# Patient Record
Sex: Female | Born: 1995 | Race: White | Hispanic: No | Marital: Single | Smoking: Never smoker
Health system: Southern US, Community
[De-identification: ages and names within clinical notes are randomized; demographics above are authoritative.]

## PROBLEM LIST (undated history)

## (undated) DIAGNOSIS — Z789 Other specified health status: Secondary | ICD-10-CM

## (undated) HISTORY — PX: GANGLION CYST EXCISION: SHX1691

## (undated) HISTORY — DX: Other specified health status: Z78.9

## (undated) HISTORY — PX: WISDOM TOOTH EXTRACTION: SHX21

---
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
## 2019-06-06 NOTE — L&D Delivery Note (Signed)
[facility]N [facility] Delivery Note  Maxie AMARIZ FLAMENCO is a 24 y.o. G1P0 s/p vaginal delivery at [redacted]w[redacted]d. She was admitted for labor management s/p SROM.   ROM: 22h 18m with clear fluid GBS Status: negative Maximum Maternal Temperature: 98.81F  Labor Progress: Pt was admitted s/p SROM for clear fluid at home at approximately 9AM on 03/01/20. On arrival pt was started on pitocin with good fetal tolerance. She had complete cervical dilation at [phone] and then delivered without complication as noted below.  Delivery Date/Time: [phone] on 03/02/20 Delivery: Called to room and patient was complete and pushing. Head delivered LOA. No nuchal cord present. Shoulder and body delivered in usual fashion. Infant with spontaneous cry, placed on mother's abdomen, dried and stimulated. Cord clamped x 2 after 1-minute delay, and cut by FOB under my direct supervision. Cord blood drawn. Placenta delivered spontaneously with gentle cord traction. Fundus firm with massage and Pitocin. Labia, perineum, vagina, and cervix were inspected, and notable for a second degree perineal laceration and right sulcal laceration.  Placenta: intact, 3-vessel cord Complications: none Lacerations: second degree perineal laceration, right sulcal laceration s/p repair EBL: 150 ml Analgesia: epidural  Infant: female  APGARs 8 & 9  weight 3315 g  Lynnda Shields, MD OB/GYN Fellow, Faculty Practice

## 2019-07-01 ENCOUNTER — Telehealth: Payer: Self-pay | Admitting: Adult Health

## 2019-07-01 NOTE — Telephone Encounter (Signed)

## 2019-07-02 ENCOUNTER — Encounter: Payer: Self-pay | Admitting: Adult Health

## 2019-07-02 ENCOUNTER — Other Ambulatory Visit: Payer: Self-pay

## 2019-07-02 ENCOUNTER — Ambulatory Visit (INDEPENDENT_AMBULATORY_CARE_PROVIDER_SITE_OTHER): Payer: BC Managed Care – PPO | Admitting: Adult Health

## 2019-07-02 VITALS — BP 114/71 | HR 94 | Ht 63.0 in | Wt 132.0 lb

## 2019-07-02 DIAGNOSIS — Z3A01 Less than 8 weeks gestation of pregnancy: Secondary | ICD-10-CM

## 2019-07-02 DIAGNOSIS — Z3201 Encounter for pregnancy test, result positive: Secondary | ICD-10-CM

## 2019-07-02 DIAGNOSIS — O3680X0 Pregnancy with inconclusive fetal viability, not applicable or unspecified: Secondary | ICD-10-CM | POA: Diagnosis not present

## 2019-07-02 LAB — POCT URINE PREGNANCY: Preg Test, Ur: POSITIVE — AB

## 2019-07-02 NOTE — Progress Notes (Signed)
  Subjective:     Patient ID: Pamela Gomez, female   DOB: 27-May-1996, 24 y.o.   MRN: [id number]  HPI Pamela Gomez is a 24 year old white female, single,(she lives with boyfriend) in for UPT, has missed a period and had 3+HPTs.She has had some nausea and spotting after sex, no pain.She is in radiology school.  PCP is [facility].  Review of Systems +missed period 3+HPTs +nausea +spotted after sex, pink/brown,no pain  Reviewed past medical,surgical, social and family history. Reviewed medications and allergies.     Objective:   Physical Exam BP 114/71 (BP Location: Right Arm, Patient Position: Sitting, Cuff Size: Normal)   Pulse 94   Ht 5\' 3"  (1.6 m)   Wt 132 lb (59.9 kg)   LMP 05/20/2019   BMI 23.38 kg/m UPT +, about 6+1 weeks by LMP with EDD 02/24/20.Skin warm and dry. Neck: mid line trachea, normal thyroid, good ROM, no lymphadenopathy noted. Lungs: clear to ausculation bilaterally. Cardiovascular: regular rate and rhythm.Abdomen is soft and non tender Fall risk is moderate, she says she is clumsy PHQ 2 score is 1    Assessment:     1. Pregnancy examination or test, positive result  2. Less than [redacted] weeks gestation of pregnancy Continue PNV   3. Encounter to determine fetal viability of pregnancy, single or unspecified fetus Return in about 2 weeks for dating 02/26/20    Plan:     Review handout by [facility]

## 2019-07-02 NOTE — Patient Instructions (Signed)
First Trimester of Pregnancy The first trimester of pregnancy is from week 1 until the end of week 13 (months 1 through 3). A week after a sperm fertilizes an egg, the egg will implant on the wall of the uterus. This embryo will begin to develop into a baby. Genes from you and your partner will form the baby. The female genes will determine whether the baby will be a boy or a girl. At 6-8 weeks, the eyes and face will be formed, and the heartbeat can be seen on ultrasound. At the end of 12 weeks, all the baby's organs will be formed. Now that you are pregnant, you will want to do everything you can to have a healthy baby. Two of the most important things are to get good prenatal care and to follow your health care provider's instructions. Prenatal care is all the medical care you receive before the baby's birth. This care will help prevent, find, and treat any problems during the pregnancy and childbirth. Body changes during your first trimester Your body goes through many changes during pregnancy. The changes vary from woman to woman.  You may gain or lose a couple of pounds at first.  You may feel sick to your stomach (nauseous) and you may throw up (vomit). If the vomiting is uncontrollable, call your health care provider.  You may tire easily.  You may develop headaches that can be relieved by medicines. All medicines should be approved by your health care provider.  You may urinate more often. Painful urination may mean you have a bladder infection.  You may develop heartburn as a result of your pregnancy.  You may develop constipation because certain hormones are causing the muscles that push stool through your intestines to slow down.  You may develop hemorrhoids or swollen veins (varicose veins).  Your breasts may begin to grow larger and become tender. Your nipples may stick out more, and the tissue that surrounds them (areola) may become darker.  Your gums may bleed and may be  sensitive to brushing and flossing.  Dark spots or blotches (chloasma, mask of pregnancy) may develop on your face. This will likely fade after the baby is born.  Your menstrual periods will stop.  You may have a loss of appetite.  You may develop cravings for certain kinds of food.  You may have changes in your emotions from day to day, such as being excited to be pregnant or being concerned that something may go wrong with the pregnancy and baby.  You may have more vivid and strange dreams.  You may have changes in your hair. These can include thickening of your hair, rapid growth, and changes in texture. Some women also have hair loss during or after pregnancy, or hair that feels dry or thin. Your hair will most likely return to normal after your baby is born. What to expect at prenatal visits During a routine prenatal visit:  You will be weighed to make sure you and the baby are growing normally.  Your blood pressure will be taken.  Your abdomen will be measured to track your baby's growth.  The fetal heartbeat will be listened to between weeks 10 and 14 of your pregnancy.  Test results from any previous visits will be discussed. Your health care provider may ask you:  How you are feeling.  If you are feeling the baby move.  If you have had any abnormal symptoms, such as leaking fluid, bleeding, severe headaches, or abdominal   cramping.  If you are using any tobacco products, including cigarettes, chewing tobacco, and electronic cigarettes.  If you have any questions. Other tests that may be performed during your first trimester include:  Blood tests to find your blood type and to check for the presence of any previous infections. The tests will also be used to check for low iron levels (anemia) and protein on red blood cells (Rh antibodies). Depending on your risk factors, or if you previously had diabetes during pregnancy, you may have tests to check for high blood sugar  that affects pregnant women (gestational diabetes).  Urine tests to check for infections, diabetes, or protein in the urine.  An ultrasound to confirm the proper growth and development of the baby.  Fetal screens for spinal cord problems (spina bifida) and Down syndrome.  HIV (human immunodeficiency virus) testing. Routine prenatal testing includes screening for HIV, unless you choose not to have this test.  You may need other tests to make sure you and the baby are doing well. Follow these instructions at home: Medicines  Follow your health care provider's instructions regarding medicine use. Specific medicines may be either safe or unsafe to take during pregnancy.  Take a prenatal vitamin that contains at least 600 micrograms (mcg) of folic acid.  If you develop constipation, try taking a stool softener if your health care provider approves. Eating and drinking   Eat a balanced diet that includes fresh fruits and vegetables, whole grains, good sources of protein such as meat, eggs, or tofu, and low-fat dairy. Your health care provider will help you determine the amount of weight gain that is right for you.  Avoid raw meat and uncooked cheese. These carry germs that can cause birth defects in the baby.  Eating four or five small meals rather than three large meals a day may help relieve nausea and vomiting. If you start to feel nauseous, eating a few soda crackers can be helpful. Drinking liquids between meals, instead of during meals, also seems to help ease nausea and vomiting.  Limit foods that are high in fat and processed sugars, such as fried and sweet foods.  To prevent constipation: ? Eat foods that are high in fiber, such as fresh fruits and vegetables, whole grains, and beans. ? Drink enough fluid to keep your urine clear or pale yellow. Activity  Exercise only as directed by your health care provider. Most women can continue their usual exercise routine during  pregnancy. Try to exercise for 30 minutes at least 5 days a week. Exercising will help you: ? Control your weight. ? Stay in shape. ? Be prepared for labor and delivery.  Experiencing pain or cramping in the lower abdomen or lower back is a good sign that you should stop exercising. Check with your health care provider before continuing with normal exercises.  Try to avoid standing for long periods of time. Move your legs often if you must stand in one place for a long time.  Avoid heavy lifting.  Wear low-heeled shoes and practice good posture.  You may continue to have sex unless your health care provider tells you not to. Relieving pain and discomfort  Wear a good support bra to relieve breast tenderness.  Take warm sitz baths to soothe any pain or discomfort caused by hemorrhoids. Use hemorrhoid cream if your health care provider approves.  Rest with your legs elevated if you have leg cramps or low back pain.  If you develop varicose veins in   your legs, wear support hose. Elevate your feet for 15 minutes, 3-4 times a day. Limit salt in your diet. Prenatal care  Schedule your prenatal visits by the twelfth week of pregnancy. They are usually scheduled monthly at first, then more often in the last 2 months before delivery.  Write down your questions. Take them to your prenatal visits.  Keep all your prenatal visits as told by your health care provider. This is important. Safety  Wear your seat belt at all times when driving.  Make a list of emergency phone numbers, including numbers for family, friends, the hospital, and police and fire departments. General instructions  Ask your health care provider for a referral to a local prenatal education class. Begin classes no later than the beginning of month 6 of your pregnancy.  Ask for help if you have counseling or nutritional needs during pregnancy. Your health care provider can offer advice or refer you to specialists for help  with various needs.  Do not use hot tubs, steam rooms, or saunas.  Do not douche or use tampons or scented sanitary pads.  Do not cross your legs for long periods of time.  Avoid cat litter boxes and soil used by cats. These carry germs that can cause birth defects in the baby and possibly loss of the fetus by miscarriage or stillbirth.  Avoid all smoking, herbs, alcohol, and medicines not prescribed by your health care provider. Chemicals in these products affect the formation and growth of the baby.  Do not use any products that contain nicotine or tobacco, such as cigarettes and e-cigarettes. If you need help quitting, ask your health care provider. You may receive counseling support and other resources to help you quit.  Schedule a dentist appointment. At home, brush your teeth with a soft toothbrush and be gentle when you floss. Contact a health care provider if:  You have dizziness.  You have mild pelvic cramps, pelvic pressure, or nagging pain in the abdominal area.  You have persistent nausea, vomiting, or diarrhea.  You have a bad smelling vaginal discharge.  You have pain when you urinate.  You notice increased swelling in your face, hands, legs, or ankles.  You are exposed to fifth disease or chickenpox.  You are exposed to German measles (rubella) and have never had it. Get help right away if:  You have a fever.  You are leaking fluid from your vagina.  You have spotting or bleeding from your vagina.  You have severe abdominal cramping or pain.  You have rapid weight gain or loss.  You vomit blood or material that looks like coffee grounds.  You develop a severe headache.  You have shortness of breath.  You have any kind of trauma, such as from a fall or a car accident. Summary  The first trimester of pregnancy is from week 1 until the end of week 13 (months 1 through 3).  Your body goes through many changes during pregnancy. The changes vary from  woman to woman.  You will have routine prenatal visits. During those visits, your health care provider will examine you, discuss any test results you may have, and talk with you about how you are feeling. This information is not intended to replace advice given to you by your health care provider. Make sure you discuss any questions you have with your health care provider. Document Revised: 05/04/2017 Document Reviewed: 05/03/2016 Elsevier Patient Education  2020 [employer]  

## 2019-07-16 ENCOUNTER — Telehealth: Payer: Self-pay | Admitting: Obstetrics & Gynecology

## 2019-07-16 NOTE — Telephone Encounter (Signed)

## 2019-07-17 ENCOUNTER — Ambulatory Visit (INDEPENDENT_AMBULATORY_CARE_PROVIDER_SITE_OTHER): Payer: Self-pay

## 2019-07-17 ENCOUNTER — Other Ambulatory Visit: Payer: Self-pay

## 2019-07-17 DIAGNOSIS — O3680X0 Pregnancy with inconclusive fetal viability, not applicable or unspecified: Secondary | ICD-10-CM

## 2019-07-17 DIAGNOSIS — Z3A08 8 weeks gestation of pregnancy: Secondary | ICD-10-CM

## 2019-07-17 NOTE — Progress Notes (Signed)
[place] 8+2 wks,single IUP w/ys,positive fht 176 bpm,normal ovaries,crl 18.64 mm

## 2019-08-13 ENCOUNTER — Telehealth: Payer: Self-pay | Admitting: Obstetrics & Gynecology

## 2019-08-13 ENCOUNTER — Other Ambulatory Visit: Payer: Self-pay | Admitting: Obstetrics and Gynecology

## 2019-08-13 DIAGNOSIS — Z3682 Encounter for antenatal screening for nuchal translucency: Secondary | ICD-10-CM

## 2019-08-13 NOTE — Telephone Encounter (Signed)

## 2019-08-14 ENCOUNTER — Ambulatory Visit (INDEPENDENT_AMBULATORY_CARE_PROVIDER_SITE_OTHER): Payer: BC Managed Care – PPO

## 2019-08-14 ENCOUNTER — Ambulatory Visit: Payer: BC Managed Care – PPO | Admitting: *Deleted

## 2019-08-14 ENCOUNTER — Ambulatory Visit (INDEPENDENT_AMBULATORY_CARE_PROVIDER_SITE_OTHER): Payer: BC Managed Care – PPO | Admitting: Advanced Practice Midwife

## 2019-08-14 ENCOUNTER — Other Ambulatory Visit: Payer: Self-pay

## 2019-08-14 ENCOUNTER — Encounter: Payer: Self-pay | Admitting: Advanced Practice Midwife

## 2019-08-14 VITALS — BP 118/65 | HR 82 | Wt 129.0 lb

## 2019-08-14 DIAGNOSIS — Z3401 Encounter for supervision of normal first pregnancy, first trimester: Secondary | ICD-10-CM

## 2019-08-14 DIAGNOSIS — Z3A12 12 weeks gestation of pregnancy: Secondary | ICD-10-CM

## 2019-08-14 DIAGNOSIS — Z3402 Encounter for supervision of normal first pregnancy, second trimester: Secondary | ICD-10-CM

## 2019-08-14 DIAGNOSIS — Z3682 Encounter for antenatal screening for nuchal translucency: Secondary | ICD-10-CM | POA: Diagnosis not present

## 2019-08-14 DIAGNOSIS — Z3400 Encounter for supervision of normal first pregnancy, unspecified trimester: Secondary | ICD-10-CM | POA: Insufficient documentation

## 2019-08-14 LAB — POCT URINALYSIS DIPSTICK OB
Blood, UA: NEGATIVE
Glucose, UA: NEGATIVE
Ketones, UA: NEGATIVE
Leukocytes, UA: NEGATIVE
Nitrite, UA: NEGATIVE
POC,PROTEIN,UA: NEGATIVE

## 2019-08-14 MED ORDER — BLOOD PRESSURE MONITOR MISC: 0 refills | Status: DC

## 2019-08-14 NOTE — Patient Instructions (Signed)
Pamela Gomez, I greatly value your feedback.  If you receive a survey following your visit with [place] today, we appreciate you taking the time to fill it out.  Thanks, Cathie Beams, DNP, CNM  [facility] HAS MOVED!!! It is now [facility] at [facility] ([address] [place], [place] [address]) Entrance located off of [address] Free 24/7 valet parking   Nausea & Vomiting  Have saltine crackers or pretzels by your bed and eat a few bites before you raise your head out of bed in the morning  Eat small frequent meals throughout the day instead of large meals  Drink plenty of fluids throughout the day to stay hydrated, just don't drink a lot of fluids with your meals.  This can make your stomach fill up faster making you feel sick  Do not brush your teeth right after you eat  Products with real ginger are good for nausea, like ginger ale and ginger hard candy Make sure it says made with real ginger!  Sucking on sour candy like lemon heads is also good for nausea  If your prenatal vitamins make you nauseated, take them at night so you will sleep through the nausea  Sea Bands  If you feel like you need medicine for the nausea & vomiting please let us know  If you are unable to keep any fluids or food down please let us know   Constipation  Drink plenty of fluid, preferably water, throughout the day  Eat foods high in fiber such as fruits, vegetables, and grains  Exercise, such as walking, is a good way to keep your bowels regular  Drink warm fluids, especially warm prune juice, or decaf coffee  Eat a 1/2 cup of real oatmeal (not instant), 1/2 cup applesauce, and 1/2-1 cup warm prune juice every day  If needed, you may take Colace (docusate sodium) stool softener once or twice a day to help keep the stool soft.   If you still are having problems with constipation, you may take Miralax once daily as needed to help keep your bowels regular.   Home  Blood Pressure Monitoring for Patients   Your provider has recommended that you check your blood pressure (BP) at least once a week at home. If you do not have a blood pressure cuff at home, one will be provided for you. Contact your provider if you have not received your monitor within 1 week.   Helpful Tips for Accurate Home Blood Pressure Checks  . Don't smoke, exercise, or drink caffeine 30 minutes before checking your BP . Use the restroom before checking your BP (a full bladder can raise your pressure) . Relax in a comfortable upright chair . Feet on the ground . Left arm resting comfortably on a flat surface at the level of your heart . Legs uncrossed . Back supported . Sit quietly and don't talk . Place the cuff on your bare arm . Adjust snuggly, so that only two fingertips can fit between your skin and the top of the cuff . Check 2 readings separated by at least one minute . Keep a log of your BP readings . For a visual, please reference this diagram: http://ccnc.care/bpdiagram  Provider Name: [facility]     Phone: [phone]  Zone 1: ALL CLEAR  Continue to monitor your symptoms:  . BP reading is less than 140 (top number) or less than 90 (bottom number)  . No right upper stomach  pain . No headaches or seeing spots . No feeling nauseated or throwing up . No swelling in face and hands  Zone 2: CAUTION Call your doctor's office for any of the following:  . BP reading is greater than 140 (top number) or greater than 90 (bottom number)  . Stomach pain under your ribs in the middle or right side . Headaches or seeing spots . Feeling nauseated or throwing up . Swelling in face and hands  Zone 3: EMERGENCY  Seek immediate medical care if you have any of the following:  . BP reading is greater than160 (top number) or greater than 110 (bottom number) . Severe headaches not improving with Tylenol . Serious difficulty catching your breath . Any worsening symptoms  from Zone 2    First Trimester of Pregnancy The first trimester of pregnancy is from week 1 until the end of week 12 (months 1 through 3). A week after a sperm fertilizes an egg, the egg will implant on the wall of the uterus. This embryo will begin to develop into a baby. Genes from you and your partner are forming the baby. The female genes determine whether the baby is a boy or a girl. At 6-8 weeks, the eyes and face are formed, and the heartbeat can be seen on ultrasound. At the end of 12 weeks, all the baby's organs are formed.  Now that you are pregnant, you will want to do everything you can to have a healthy baby. Two of the most important things are to get good prenatal care and to follow your health care provider's instructions. Prenatal care is all the medical care you receive before the baby's birth. This care will help prevent, find, and treat any problems during the pregnancy and childbirth. BODY CHANGES Your body goes through many changes during pregnancy. The changes vary from woman to woman.   You may gain or lose a couple of pounds at first.  You may feel sick to your stomach (nauseous) and throw up (vomit). If the vomiting is uncontrollable, call your health care provider.  You may tire easily.  You may develop headaches that can be relieved by medicines approved by your health care provider.  You may urinate more often. Painful urination may mean you have a bladder infection.  You may develop heartburn as a result of your pregnancy.  You may develop constipation because certain hormones are causing the muscles that push waste through your intestines to slow down.  You may develop hemorrhoids or swollen, bulging veins (varicose veins).  Your breasts may begin to grow larger and become tender. Your nipples may stick out more, and the tissue that surrounds them (areola) may become darker.  Your gums may bleed and may be sensitive to brushing and flossing.  Dark spots or  blotches (chloasma, mask of pregnancy) may develop on your face. This will likely fade after the baby is born.  Your menstrual periods will stop.  You may have a loss of appetite.  You may develop cravings for certain kinds of food.  You may have changes in your emotions from day to day, such as being excited to be pregnant or being concerned that something may go wrong with the pregnancy and baby.  You may have more vivid and strange dreams.  You may have changes in your hair. These can include thickening of your hair, rapid growth, and changes in texture. Some women also have hair loss during or after pregnancy, or hair that  feels dry or thin. Your hair will most likely return to normal after your baby is born. WHAT TO EXPECT AT YOUR PRENATAL VISITS During a routine prenatal visit:  You will be weighed to make sure you and the baby are growing normally.  Your blood pressure will be taken.  Your abdomen will be measured to track your baby's growth.  The fetal heartbeat will be listened to starting around week 10 or 12 of your pregnancy.  Test results from any previous visits will be discussed. Your health care provider may ask you:  How you are feeling.  If you are feeling the baby move.  If you have had any abnormal symptoms, such as leaking fluid, bleeding, severe headaches, or abdominal cramping.  If you have any questions. Other tests that may be performed during your first trimester include:  Blood tests to find your blood type and to check for the presence of any previous infections. They will also be used to check for low iron levels (anemia) and Rh antibodies. Later in the pregnancy, blood tests for diabetes will be done along with other tests if problems develop.  Urine tests to check for infections, diabetes, or protein in the urine.  An ultrasound to confirm the proper growth and development of the baby.  An amniocentesis to check for possible genetic problems.   Fetal screens for spina bifida and Down syndrome.  You may need other tests to make sure you and the baby are doing well. HOME CARE INSTRUCTIONS  Medicines  Follow your health care provider's instructions regarding medicine use. Specific medicines may be either safe or unsafe to take during pregnancy.  Take your prenatal vitamins as directed.  If you develop constipation, try taking a stool softener if your health care provider approves. Diet  Eat regular, well-balanced meals. Choose a variety of foods, such as meat or vegetable-based protein, fish, milk and low-fat dairy products, vegetables, fruits, and whole grain breads and cereals. Your health care provider will help you determine the amount of weight gain that is right for you.  Avoid raw meat and uncooked cheese. These carry germs that can cause birth defects in the baby.  Eating four or five small meals rather than three large meals a day may help relieve nausea and vomiting. If you start to feel nauseous, eating a few soda crackers can be helpful. Drinking liquids between meals instead of during meals also seems to help nausea and vomiting.  If you develop constipation, eat more high-fiber foods, such as fresh vegetables or fruit and whole grains. Drink enough fluids to keep your urine clear or pale yellow. Activity and Exercise  Exercise only as directed by your health care provider. Exercising will help you:  Control your weight.  Stay in shape.  Be prepared for labor and delivery.  Experiencing pain or cramping in the lower abdomen or low back is a good sign that you should stop exercising. Check with your health care provider before continuing normal exercises.  Try to avoid standing for long periods of time. Move your legs often if you must stand in one place for a long time.  Avoid heavy lifting.  Wear low-heeled shoes, and practice good posture.  You may continue to have sex unless your health care provider  directs you otherwise. Relief of Pain or Discomfort  Wear a good support bra for breast tenderness.    Take warm sitz baths to soothe any pain or discomfort caused by hemorrhoids. Use hemorrhoid cream  if your health care provider approves.    Rest with your legs elevated if you have leg cramps or low back pain.  If you develop varicose veins in your legs, wear support hose. Elevate your feet for 15 minutes, 3-4 times a day. Limit salt in your diet. Prenatal Care  Schedule your prenatal visits by the twelfth week of pregnancy. They are usually scheduled monthly at first, then more often in the last 2 months before delivery.  Write down your questions. Take them to your prenatal visits.  Keep all your prenatal visits as directed by your health care provider. Safety  Wear your seat belt at all times when driving.  Make a list of emergency phone numbers, including numbers for family, friends, the hospital, and police and fire departments. General Tips  Ask your health care provider for a referral to a local prenatal education class. Begin classes no later than at the beginning of month 6 of your pregnancy.  Ask for help if you have counseling or nutritional needs during pregnancy. Your health care provider can offer advice or refer you to specialists for help with various needs.  Do not use hot tubs, steam rooms, or saunas.  Do not douche or use tampons or scented sanitary pads.  Do not cross your legs for long periods of time.  Avoid cat litter boxes and soil used by cats. These carry germs that can cause birth defects in the baby and possibly loss of the fetus by miscarriage or stillbirth.  Avoid all smoking, herbs, alcohol, and medicines not prescribed by your health care provider. Chemicals in these affect the formation and growth of the baby.  Schedule a dentist appointment. At home, brush your teeth with a soft toothbrush and be gentle when you floss. SEEK MEDICAL CARE IF:    You have dizziness.  You have mild pelvic cramps, pelvic pressure, or nagging pain in the abdominal area.  You have persistent nausea, vomiting, or diarrhea.  You have a bad smelling vaginal discharge.  You have pain with urination.  You notice increased swelling in your face, hands, legs, or ankles. SEEK IMMEDIATE MEDICAL CARE IF:   You have a fever.  You are leaking fluid from your vagina.  You have spotting or bleeding from your vagina.  You have severe abdominal cramping or pain.  You have rapid weight gain or loss.  You vomit blood or material that looks like coffee grounds.  You are exposed to [place] measles and have never had them.  You are exposed to fifth disease or chickenpox.  You develop a severe headache.  You have shortness of breath.  You have any kind of trauma, such as from a fall or a car accident. Document Released: 05/16/2001 Document Revised: 10/06/2013 Document Reviewed: 04/01/2013 [employer] [place] [employer] Patient Information 2015 [place], [place]. This information is not intended to replace advice given to you by your health care provider. Make sure you discuss any questions you have with your health care provider.  Coronavirus (COVID-19) Are you at risk?  Are you at risk for the Coronavirus (COVID-19)?  To be considered HIGH RISK for Coronavirus (COVID-19), you have to meet the following criteria:  . Traveled to [place], [place], [place], [place] or [place]; or in the [place] to [place], [place], [place], or [place]; and have fever, cough, and shortness of breath within the last 2 weeks of travel OR . Been in close contact with a person diagnosed with COVID-19 within the last 2  weeks and have fever, cough, and shortness of breath . IF YOU DO NOT MEET THESE CRITERIA, YOU ARE CONSIDERED LOW RISK FOR COVID-19.  What to do if you are HIGH RISK for COVID-19?  Marland Kitchen If you are having a medical emergency, call 911. . Seek medical care right away. Before  you go to a doctor's office, urgent care or emergency department, call ahead and tell them about your recent travel, contact with someone diagnosed with COVID-19, and your symptoms. You should receive instructions from your physician's office regarding next steps of care.  . When you arrive at healthcare provider, tell the healthcare staff immediately you have returned from visiting [place], [place], [place], [place] or [place]; or traveled in the [place] to [place], [place], [place], or [place]; in the last two weeks or you have been in close contact with a person diagnosed with COVID-19 in the last 2 weeks.   . Tell the health care staff about your symptoms: fever, cough and shortness of breath. . After you have been seen by a medical provider, you will be either: o Tested for (COVID-19) and discharged home on quarantine except to seek medical care if symptoms worsen, and asked to  - Stay home and avoid contact with others until you get your results (4-5 days)  - Avoid travel on public transportation if possible (such as bus, train, or airplane) or o Sent to the Emergency Department by EMS for evaluation, COVID-19 testing, and possible admission depending on your condition and test results.  What to do if you are LOW RISK for COVID-19?  Reduce your risk of any infection by using the same precautions used for avoiding the common cold or flu:  Marland Kitchen Wash your hands often with soap and warm water for at least 20 seconds.  If soap and water are not readily available, use an alcohol-based hand sanitizer with at least 60% alcohol.  . If coughing or sneezing, cover your mouth and nose by coughing or sneezing into the elbow areas of your shirt or coat, into a tissue or into your sleeve (not your hands). . Avoid shaking hands with others and consider head nods or verbal greetings only. . Avoid touching your eyes, nose, or mouth with unwashed hands.  . Avoid close contact with people who are sick. .  Avoid places or events with large numbers of people in one location, like concerts or sporting events. . Carefully consider travel plans you have or are making. . If you are planning any travel outside or inside the [place], visit the CDC's Travelers' Health webpage for the latest health notices. . If you have some symptoms but not all symptoms, continue to monitor at home and seek medical attention if your symptoms worsen. . If you are having a medical emergency, call 911.   ADDITIONAL HEALTHCARE OPTIONS FOR PATIENTS  [facility] / e-Visit: [email or web address]         [facility]: [phone]  [facility]: [phone]                   [facility]: [phone]     Safe Medications in Pregnancy   Acne: Benzoyl Peroxide Salicylic Acid  Backache/Headache: Tylenol: 2 regular strength every 4 hours OR              2 Extra strength every 6 hours  Colds/Coughs/Allergies: Benadryl (alcohol free) 25 mg every 6 hours as needed Breath right strips Claritin  Cepacol throat lozenges Chloraseptic throat spray Cold-Eeze- up to three times per day Cough drops, alcohol free Flonase (by prescription only) Guaifenesin Mucinex Robitussin DM (plain only, alcohol free) Saline nasal spray/drops Sudafed (pseudoephedrine) & Actifed ** use only after [redacted] weeks gestation and if you do not have high blood pressure Tylenol Vicks Vaporub Zinc lozenges Zyrtec   Constipation: Colace Ducolax suppositories Fleet enema Glycerin suppositories Metamucil Milk of magnesia Miralax Senokot Smooth move tea  Diarrhea: Kaopectate Imodium A-D  *NO pepto Bismol  Hemorrhoids: Anusol Anusol HC Preparation H Tucks  Indigestion: Tums Maalox Mylanta Zantac  Pepcid  Insomnia: Benadryl (alcohol free) 25mg  every 6 hours as needed Tylenol PM Unisom, no Gelcaps  Leg Cramps: Tums MagGel   Nausea/Vomiting:  Bonine Dramamine Emetrol Ginger extract Sea bands Meclizine  Nausea medication to take during pregnancy:  Unisom (doxylamine succinate 25 mg tablets) Take one tablet daily at bedtime. If symptoms are not adequately controlled, the dose can be increased to a maximum recommended dose of two tablets daily (1/2 tablet in the morning, 1/2 tablet mid-afternoon and one at bedtime). Vitamin B6 100mg  tablets. Take one tablet twice a day (up to 200 mg per day).  Skin Rashes: Aveeno products Benadryl cream or 25mg  every 6 hours as needed Calamine Lotion 1% cortisone cream  Yeast infection: Gyne-lotrimin 7 Monistat 7   **If taking multiple medications, please check labels to avoid duplicating the same active ingredients **take medication as directed on the label ** Do not exceed 4000 mg of tylenol in 24 hours **Do not take medications that contain aspirin or ibuprofen

## 2019-08-14 NOTE — Progress Notes (Signed)
[place] 12+2 wks,measurement c/w dates,crl 60.94 mm,fhr 155 bpm,anterior placenta,normal ovaries,NB present,NT 1.3 mm

## 2019-08-14 NOTE — Progress Notes (Signed)
INITIAL OBSTETRICAL VISIT Patient name: Pamela Gomez MRN [id number]  Date of birth: February 19, 1996 Chief Complaint:   Initial Prenatal Visit  History of Present Illness:   Pamela Gomez is a 24 y.o. G1P0  female at [redacted]w[redacted]d by LMP/8 week [place] with an Estimated Date of Delivery: 02/24/20 being seen today for her initial obstetrical visit.   Her obstetrical history is significant for first baby.   Today she reports no complaints.  Patient's last menstrual period was 05/20/2019. Last pap 2020. Results were: normal Review of Systems:   Pertinent items are noted in HPI Denies cramping/contractions, leakage of fluid, vaginal bleeding, abnormal vaginal discharge w/ itching/odor/irritation, headaches, visual changes, shortness of breath, chest pain, abdominal pain, severe nausea/vomiting, or problems with urination or bowel movements unless otherwise stated above.  Pertinent History Reviewed:  Reviewed past medical,surgical, social, obstetrical and family history.  Reviewed problem list, medications and allergies. OB History  Gravida Para Term Preterm AB Living  1            SAB TAB Ectopic Multiple Live Births               # Outcome Date GA Lbr Len/2nd Weight Sex Delivery Anes PTL Lv  1 Current            Physical Assessment:   Vitals:   08/14/19 1039  BP: 118/65  Pulse: 82  Weight: 129 lb (58.5 kg)  Body mass index is 22.85 kg/m.       Physical Examination:  General appearance - well appearing, and in no distress  Mental status - alert, oriented to person, place, and time  Psych:  She has a normal mood and affect  Skin - warm and dry, normal color, no suspicious lesions noted  Chest - effort normal, all lung fields clear to auscultation bilaterally  Heart - normal rate and regular rhythm  Abdomen - soft, nontender  Extremities:  No swelling or varicosities noted    via [place] 12+2 wks,measurement c/w dates,crl 60.94 mm,fhr 155 bpm,anterior placenta,normal ovaries,NB present,NT 1.3  mm  Results for orders placed or performed in visit on 08/14/19 (from the past 24 hour(s))  POC Urinalysis Dipstick OB   Collection Time: 08/14/19 10:54 AM  Result Value Ref Range   Color, UA     Clarity, UA     Glucose, UA Negative Negative   Bilirubin, UA     Ketones, UA neg    Spec Grav, UA     Blood, UA neg    pH, UA     POC,PROTEIN,UA Negative Negative, Trace, Small (1+), Moderate (2+), Large (3+), 4+   Urobilinogen, UA     Nitrite, UA neg    Leukocytes, UA Negative Negative   Appearance     Odor      Assessment & Plan:  1) Low-Risk Pregnancy G1P0 at [redacted]w[redacted]d with an Estimated Date of Delivery: 02/24/20   2) Initial OB visit    Meds:  Meds ordered this encounter  Medications  . Blood Pressure Monitor MISC    Sig: For regular home bp monitoring during pregnancy    Dispense:  1 each    Refill:  0    Z34.00    Initial labs obtained Continue prenatal vitamins Reviewed n/v relief measures and warning s/s to report Reviewed recommended weight gain based on pre-gravid BMI Encouraged well-balanced diet Watched video for carrier screening/genetic testing:  Genetic Screening discussed First Screen and Integrated Screen: requested Cystic fibrosis screening requested SMA screening  requested Fragile X screening requested Ultrasound discussed; fetal survey: requested [place] completed  Follow-up: Return in about 4 weeks (around 09/11/2019) for [facility] visit.   Orders Placed This Encounter  Procedures  . Urine Culture  . GC/Chlamydia Probe Amp  . Integrated 1  . Obstetric Panel, Including HIV  . Hepatitis C antibody  . Hgb Fractionation Cascade  . MaterniT 21 plus Core, Blood  . Pain Management Screening Profile (10S)  . Inheritest Core(CF97,SMA,FraX)  . POC Urinalysis Dipstick OB    Christin Fudge DNP, CNM 08/14/2019 11:02 AM

## 2019-08-15 ENCOUNTER — Encounter: Payer: Self-pay | Admitting: *Deleted

## 2019-08-15 LAB — PMP SCREEN PROFILE (10S), URINE
Amphetamine Scrn, Ur: NEGATIVE ng/mL
BARBITURATE SCREEN URINE: NEGATIVE ng/mL
BENZODIAZEPINE SCREEN, URINE: NEGATIVE ng/mL
CANNABINOIDS UR QL SCN: NEGATIVE ng/mL
Cocaine (Metab) Scrn, Ur: NEGATIVE ng/mL
Creatinine(Crt), U: 69.5 mg/dL (ref 20.0–300.0)
Methadone Screen, Urine: NEGATIVE ng/mL
OXYCODONE+OXYMORPHONE UR QL SCN: NEGATIVE ng/mL
Opiate Scrn, Ur: NEGATIVE ng/mL
Ph of Urine: 5.6 (ref 4.5–8.9)
Phencyclidine Qn, Ur: NEGATIVE ng/mL
Propoxyphene Scrn, Ur: NEGATIVE ng/mL

## 2019-08-20 LAB — OBSTETRIC PANEL, INCLUDING HIV
Antibody Screen: NEGATIVE
Basophils Absolute: 0 10*3/uL (ref 0.0–0.2)
Basos: 0 %
EOS (ABSOLUTE): 0.1 10*3/uL (ref 0.0–0.4)
Eos: 1 %
HIV Screen 4th Generation wRfx: NONREACTIVE
Hematocrit: 35.2 % (ref 34.0–46.6)
Hemoglobin: 12.3 g/dL (ref 11.1–15.9)
Hepatitis B Surface Ag: NEGATIVE
Immature Grans (Abs): 0 10*3/uL (ref 0.0–0.1)
Immature Granulocytes: 0 %
Lymphocytes Absolute: 1.3 10*3/uL (ref 0.7–3.1)
Lymphs: 18 %
MCH: 30.5 pg (ref 26.6–33.0)
MCHC: 34.9 g/dL (ref 31.5–35.7)
MCV: 87 fL (ref 79–97)
Monocytes Absolute: 0.4 10*3/uL (ref 0.1–0.9)
Monocytes: 5 %
Neutrophils Absolute: 5.4 10*3/uL (ref 1.4–7.0)
Neutrophils: 76 %
Platelets: 220 10*3/uL (ref 150–450)
RBC: 4.03 x10E6/uL (ref 3.77–5.28)
RDW: 13.2 % (ref 11.7–15.4)
RPR Ser Ql: NONREACTIVE
Rh Factor: POSITIVE
Rubella Antibodies, IGG: 3.79 index (ref >0.99)
WBC: 7.2 10*3/uL (ref 3.4–10.8)

## 2019-08-20 LAB — MATERNIT 21 PLUS CORE, BLOOD
Fetal Fraction: 9
Result (T21): NEGATIVE
Trisomy 13 (Patau syndrome): NEGATIVE
Trisomy 18 (Edwards syndrome): NEGATIVE
Trisomy 21 (Down syndrome): NEGATIVE

## 2019-08-20 LAB — INTEGRATED 1
Crown Rump Length: 60.9 mm
Gest. Age on Collection Date: 12.4 weeks
Maternal Age at EDD: 22.7 yr
Nuchal Translucency (NT): 1.3 mm
Number of Fetuses: 1
PAPP-A Value: 679.9 ng/mL
Weight: 129 [lb_av]

## 2019-08-20 LAB — HGB FRACTIONATION CASCADE
Hgb A2: 2.7 % (ref 1.8–3.2)
Hgb A: 97.3 % (ref 96.4–98.8)
Hgb F: 0 % (ref 0.0–2.0)
Hgb S: 0 %

## 2019-08-20 LAB — HEPATITIS C ANTIBODY: Hep C Virus Ab: <0.1 s/co ratio (ref 0.0–0.9)

## 2019-08-26 LAB — INHERITEST CORE(CF97,SMA,FRAX)

## 2019-09-11 ENCOUNTER — Telehealth (INDEPENDENT_AMBULATORY_CARE_PROVIDER_SITE_OTHER): Payer: BC Managed Care – PPO | Admitting: Advanced Practice Midwife

## 2019-09-11 ENCOUNTER — Encounter: Payer: Self-pay | Admitting: Advanced Practice Midwife

## 2019-09-11 VITALS — BP 118/64 | HR 80

## 2019-09-11 DIAGNOSIS — Z3A16 16 weeks gestation of pregnancy: Secondary | ICD-10-CM

## 2019-09-11 DIAGNOSIS — Z363 Encounter for antenatal screening for malformations: Secondary | ICD-10-CM

## 2019-09-11 DIAGNOSIS — Z3402 Encounter for supervision of normal first pregnancy, second trimester: Secondary | ICD-10-CM | POA: Diagnosis not present

## 2019-09-11 NOTE — Patient Instructions (Signed)
Pamela Gomez Pamela Gomez, I greatly value your feedback.  If you receive a survey following your visit with [place] today, we appreciate you taking the time to fill it out.  Thanks, Cathie Beams, CNM     [facility] HAS MOVED!!! It is now [facility] ([address] [place], [place] [address]) Entrance located off of [address] Free 24/7 valet parking   Go to [email or web address] to register for FREE online childbirth classes    Second Trimester of Pregnancy The second trimester is from week 14 through week 27 (months 4 through 6). The second trimester is often a time when you feel your best. Your body has adjusted to being pregnant, and you begin to feel better physically. Usually, morning sickness has lessened or quit completely, you may have more energy, and you may have an increase in appetite. The second trimester is also a time when the fetus is growing rapidly. At the end of the sixth month, the fetus is about 9 inches long and weighs about 1 pounds. You will likely begin to feel the baby move (quickening) between 16 and 20 weeks of pregnancy. Body changes during your second trimester Your body continues to go through many changes during your second trimester. The changes vary from woman to woman.  Your weight will continue to increase. You will notice your lower abdomen bulging out.  You may begin to get stretch marks on your hips, abdomen, and breasts.  You may develop headaches that can be relieved by medicines. The medicines should be approved by your health care provider.  You may urinate more often because the fetus is pressing on your bladder.  You may develop or continue to have heartburn as a result of your pregnancy.  You may develop constipation because certain hormones are causing the muscles that push waste through your intestines to slow down.  You may develop hemorrhoids or swollen, bulging veins (varicose veins).  You may have back  pain. This is caused by: ? Weight gain. ? Pregnancy hormones that are relaxing the joints in your pelvis. ? A shift in weight and the muscles that support your balance.  Your breasts will continue to grow and they will continue to become tender.  Your gums may bleed and may be sensitive to brushing and flossing.  Dark spots or blotches (chloasma, mask of pregnancy) may develop on your face. This will likely fade after the baby is born.  A dark line from your belly button to the pubic area (linea nigra) may appear. This will likely fade after the baby is born.  You may have changes in your hair. These can include thickening of your hair, rapid growth, and changes in texture. Some women also have hair loss during or after pregnancy, or hair that feels dry or thin. Your hair will most likely return to normal after your baby is born.  What to expect at prenatal visits During a routine prenatal visit:  You will be weighed to make sure you and the fetus are growing normally.  Your blood pressure will be taken.  Your abdomen will be measured to track your baby's growth.  The fetal heartbeat will be listened to.  Any test results from the previous visit will be discussed.  Your health care provider may ask you:  How you are feeling.  If you are feeling the baby move.  If you have had any abnormal symptoms, such as leaking fluid, bleeding, severe headaches, or  abdominal cramping.  If you are using any tobacco products, including cigarettes, chewing tobacco, and electronic cigarettes.  If you have any questions.  Other tests that may be performed during your second trimester include:  Blood tests that check for: ? Low iron levels (anemia). ? High blood sugar that affects pregnant women (gestational diabetes) between 10 and 28 weeks. ? Rh antibodies. This is to check for a protein on red blood cells (Rh factor).  Urine tests to check for infections, diabetes, or protein in the  urine.  An ultrasound to confirm the proper growth and development of the baby.  An amniocentesis to check for possible genetic problems.  Fetal screens for spina bifida and Down syndrome.  HIV (human immunodeficiency virus) testing. Routine prenatal testing includes screening for HIV, unless you choose not to have this test.  Follow these instructions at home: Medicines  Follow your health care provider's instructions regarding medicine use. Specific medicines may be either safe or unsafe to take during pregnancy.  Take a prenatal vitamin that contains at least 600 micrograms (mcg) of folic acid.  If you develop constipation, try taking a stool softener if your health care provider approves. Eating and drinking  Eat a balanced diet that includes fresh fruits and vegetables, whole grains, good sources of protein such as meat, eggs, or tofu, and low-fat dairy. Your health care provider will help you determine the amount of weight gain that is right for you.  Avoid raw meat and uncooked cheese. These carry germs that can cause birth defects in the baby.  If you have low calcium intake from food, talk to your health care provider about whether you should take a daily calcium supplement.  Limit foods that are high in fat and processed sugars, such as fried and sweet foods.  To prevent constipation: ? Drink enough fluid to keep your urine clear or pale yellow. ? Eat foods that are high in fiber, such as fresh fruits and vegetables, whole grains, and beans. Activity  Exercise only as directed by your health care provider. Most women can continue their usual exercise routine during pregnancy. Try to exercise for 30 minutes at least 5 days a week. Stop exercising if you experience uterine contractions.  Avoid heavy lifting, wear low heel shoes, and practice good posture.  A sexual relationship may be continued unless your health care provider directs you otherwise. Relieving pain and  discomfort  Wear a good support bra to prevent discomfort from breast tenderness.  Take warm sitz baths to soothe any pain or discomfort caused by hemorrhoids. Use hemorrhoid cream if your health care provider approves.  Rest with your legs elevated if you have leg cramps or low back pain.  If you develop varicose veins, wear support hose. Elevate your feet for 15 minutes, 3-4 times a day. Limit salt in your diet. Prenatal Care  Write down your questions. Take them to your prenatal visits.  Keep all your prenatal visits as told by your health care provider. This is important. Safety  Wear your seat belt at all times when driving.  Make a list of emergency phone numbers, including numbers for family, friends, the hospital, and police and fire departments. General instructions  Ask your health care provider for a referral to a local prenatal education class. Begin classes no later than the beginning of month 6 of your pregnancy.  Ask for help if you have counseling or nutritional needs during pregnancy. Your health care provider can offer advice or  refer you to specialists for help with various needs.  Do not use hot tubs, steam rooms, or saunas.  Do not douche or use tampons or scented sanitary pads.  Do not cross your legs for long periods of time.  Avoid cat litter boxes and soil used by cats. These carry germs that can cause birth defects in the baby and possibly loss of the fetus by miscarriage or stillbirth.  Avoid all smoking, herbs, alcohol, and unprescribed drugs. Chemicals in these products can affect the formation and growth of the baby.  Do not use any products that contain nicotine or tobacco, such as cigarettes and e-cigarettes. If you need help quitting, ask your health care provider.  Visit your dentist if you have not gone yet during your pregnancy. Use a soft toothbrush to brush your teeth and be gentle when you floss. Contact a health care provider if:  You  have dizziness.  You have mild pelvic cramps, pelvic pressure, or nagging pain in the abdominal area.  You have persistent nausea, vomiting, or diarrhea.  You have a bad smelling vaginal discharge.  You have pain when you urinate. Get help right away if:  You have a fever.  You are leaking fluid from your vagina.  You have spotting or bleeding from your vagina.  You have severe abdominal cramping or pain.  You have rapid weight gain or weight loss.  You have shortness of breath with chest pain.  You notice sudden or extreme swelling of your face, hands, ankles, feet, or legs.  You have not felt your baby move in over an hour.  You have severe headaches that do not go away when you take medicine.  You have vision changes. Summary  The second trimester is from week 14 through week 27 (months 4 through 6). It is also a time when the fetus is growing rapidly.  Your body goes through many changes during pregnancy. The changes vary from woman to woman.  Avoid all smoking, herbs, alcohol, and unprescribed drugs. These chemicals affect the formation and growth your baby.  Do not use any tobacco products, such as cigarettes, chewing tobacco, and e-cigarettes. If you need help quitting, ask your health care provider.  Contact your health care provider if you have any questions. Keep all prenatal visits as told by your health care provider. This is important. This information is not intended to replace advice given to you by your health care provider. Make sure you discuss any questions you have with your health care provider.

## 2019-09-11 NOTE — Progress Notes (Signed)
   TELEHEALTH VIRTUAL OBSTETRICS VISIT ENCOUNTER NOTE  I connected with Pamela Gomez on 09/11/19 at  9:50 AM EDT by telephone at home and verified that I am speaking with the correct person using two identifiers.   I discussed the limitations, risks, security and privacy concerns of performing an evaluation and management service by telephone and the availability of in person appointments. I also discussed with the patient that there may be a patient responsible charge related to this service. The patient expressed understanding and agreed to proceed.  Subjective:  Pamela Gomez is a 24 y.o. G1P0 at [redacted]w[redacted]d being followed for ongoing prenatal care.  She is currently monitored for the following issues for this low-risk pregnancy and has Supervision of normal first pregnancy on their problem list.  Patient reports no complaints. Reports fetal movement. Denies any contractions, bleeding or leaking of fluid.   The following portions of the patient's history were reviewed and updated as appropriate: allergies, current medications, past family history, past medical history, past social history, past surgical history and problem list.   Objective:   General:  Alert, oriented and cooperative.   Mental Status: Normal mood and affect perceived. Normal judgment and thought content.  Rest of physical exam deferred due to type of encounter  Assessment and Plan:  Pregnancy: G1P0 at [redacted]w[redacted]d There are no diagnoses linked to this encounter. Preterm labor symptoms and general obstetric precautions including but not limited to vaginal bleeding, contractions, leaking of fluid and fetal movement were reviewed in detail with the patient.  I discussed the assessment and treatment plan with the patient. The patient was provided an opportunity to ask questions and all were answered. The patient agreed with the plan and demonstrated an understanding of the instructions. The patient was advised to call back or seek an  in-person office evaluation/go to [facility] at [facility] for any urgent or concerning symptoms. Please refer to After Visit Summary for other counseling recommendations.   I provided 10 minutes of non-face-to-face time during this encounter.  Return in about 3 weeks (around 10/02/2019) for LROB, [id number].  No future appointments.  Jacklyn Shell, CNM [facility] [employer], [facility]

## 2019-10-02 ENCOUNTER — Other Ambulatory Visit: Payer: Self-pay

## 2019-10-02 ENCOUNTER — Encounter: Payer: Self-pay | Admitting: Women's Health

## 2019-10-02 ENCOUNTER — Ambulatory Visit (INDEPENDENT_AMBULATORY_CARE_PROVIDER_SITE_OTHER): Payer: BC Managed Care – PPO

## 2019-10-02 ENCOUNTER — Ambulatory Visit (INDEPENDENT_AMBULATORY_CARE_PROVIDER_SITE_OTHER): Payer: BC Managed Care – PPO | Admitting: Women's Health

## 2019-10-02 VITALS — BP 107/65 | HR 99 | Wt 131.8 lb

## 2019-10-02 DIAGNOSIS — Z363 Encounter for antenatal screening for malformations: Secondary | ICD-10-CM | POA: Diagnosis not present

## 2019-10-02 DIAGNOSIS — Z3A19 19 weeks gestation of pregnancy: Secondary | ICD-10-CM | POA: Diagnosis not present

## 2019-10-02 DIAGNOSIS — Z3402 Encounter for supervision of normal first pregnancy, second trimester: Secondary | ICD-10-CM

## 2019-10-02 DIAGNOSIS — Z3400 Encounter for supervision of normal first pregnancy, unspecified trimester: Secondary | ICD-10-CM

## 2019-10-02 DIAGNOSIS — Z1389 Encounter for screening for other disorder: Secondary | ICD-10-CM

## 2019-10-02 DIAGNOSIS — Z331 Pregnant state, incidental: Secondary | ICD-10-CM

## 2019-10-02 LAB — POCT URINALYSIS DIPSTICK OB
Blood, UA: NEGATIVE
Glucose, UA: NEGATIVE
Ketones, UA: NEGATIVE
Leukocytes, UA: NEGATIVE
Nitrite, UA: NEGATIVE
POC,PROTEIN,UA: NEGATIVE

## 2019-10-02 NOTE — Patient Instructions (Signed)
Pamela Gomez, I greatly value your feedback.  If you receive a survey following your visit with [place] today, we appreciate you taking the time to fill it out.  Thanks, Joellyn Haff, CNM, WHNP-BC  [facility] ([address] [place], [place] [address]) Entrance C, located off of [address] valet parking  Go to [email or web address] to register for FREE online childbirth classes  [place] Pediatricians/Family Doctors:  [facility] [phone]            [facility] [phone]                 [facility] [phone] (usually not accepting new patients unless you have family there already, you are always welcome to call and ask)       [facility] Department [phone]       [facility] Pediatricians/Family Doctors:   [facility]: [phone]  [facility]: [phone]  [facility]: [phone]  [facility] Doctors:   [facility]: [phone]   [facility]: [phone]  [facility] Doctors:  [facility]: [phone]    Home Blood Pressure Monitoring for Patients   Your provider has recommended that you check your blood pressure (BP) at least once a week at home. If you do not have a blood pressure cuff at home, one will be provided for you. Contact your provider if you have not received your monitor within 1 week.   Helpful Tips for Accurate Home Blood Pressure Checks  . Don't smoke, exercise, or drink caffeine 30 minutes before checking your BP . Use the restroom before checking your BP (a full bladder can raise your pressure) . Relax in a comfortable upright chair . Feet on the ground . Left arm resting comfortably on a flat surface at the level of your heart . Legs uncrossed . Back supported . Sit quietly and don't talk . Place the cuff on your bare arm . Adjust snuggly, so  that only two fingertips can fit between your skin and the top of the cuff . Check 2 readings separated by at least one minute . Keep a log of your BP readings . For a visual, please reference this diagram: http://ccnc.care/bpdiagram  Provider Name: [facility]     Phone: [phone]  Zone 1: ALL CLEAR  Continue to monitor your symptoms:  . BP reading is less than 140 (top number) or less than 90 (bottom number)  . No right upper stomach pain . No headaches or seeing spots . No feeling nauseated or throwing up . No swelling in face and hands  Zone 2: CAUTION Call your doctor's office for any of the following:  . BP reading is greater than 140 (top number) or greater than 90 (bottom number)  . Stomach pain under your ribs in the middle or right side . Headaches or seeing spots . Feeling nauseated or throwing up . Swelling in face and hands  Zone 3: EMERGENCY  Seek immediate medical care if you have any of the following:  . BP reading is greater than160 (top number) or greater than 110 (bottom number) . Severe headaches not improving with Tylenol . Serious difficulty catching your breath . Any worsening symptoms from Zone 2     Second Trimester of Pregnancy The second trimester is from week 14 through week 27 (months 4 through 6). The second trimester is often a time when you feel your  best. Your body has adjusted to being pregnant, and you begin to feel better physically. Usually, morning sickness has lessened or quit completely, you may have more energy, and you may have an increase in appetite. The second trimester is also a time when the fetus is growing rapidly. At the end of the sixth month, the fetus is about 9 inches long and weighs about 1 pounds. You will likely begin to feel the baby move (quickening) between 16 and 20 weeks of pregnancy. Body changes during your second trimester Your body continues to go through many changes during your second trimester. The  changes vary from woman to woman.  Your weight will continue to increase. You will notice your lower abdomen bulging out.  You may begin to get stretch marks on your hips, abdomen, and breasts.  You may develop headaches that can be relieved by medicines. The medicines should be approved by your health care provider.  You may urinate more often because the fetus is pressing on your bladder.  You may develop or continue to have heartburn as a result of your pregnancy.  You may develop constipation because certain hormones are causing the muscles that push waste through your intestines to slow down.  You may develop hemorrhoids or swollen, bulging veins (varicose veins).  You may have back pain. This is caused by: ? Weight gain. ? Pregnancy hormones that are relaxing the joints in your pelvis. ? A shift in weight and the muscles that support your balance.  Your breasts will continue to grow and they will continue to become tender.  Your gums may bleed and may be sensitive to brushing and flossing.  Dark spots or blotches (chloasma, mask of pregnancy) may develop on your face. This will likely fade after the baby is born.  A dark line from your belly button to the pubic area (linea nigra) may appear. This will likely fade after the baby is born.  You may have changes in your hair. These can include thickening of your hair, rapid growth, and changes in texture. Some women also have hair loss during or after pregnancy, or hair that feels dry or thin. Your hair will most likely return to normal after your baby is born.  What to expect at prenatal visits During a routine prenatal visit:  You will be weighed to make sure you and the fetus are growing normally.  Your blood pressure will be taken.  Your abdomen will be measured to track your baby's growth.  The fetal heartbeat will be listened to.  Any test results from the previous visit will be discussed.  Your health care  provider may ask you:  How you are feeling.  If you are feeling the baby move.  If you have had any abnormal symptoms, such as leaking fluid, bleeding, severe headaches, or abdominal cramping.  If you are using any tobacco products, including cigarettes, chewing tobacco, and electronic cigarettes.  If you have any questions.  Other tests that may be performed during your second trimester include:  Blood tests that check for: ? Low iron levels (anemia). ? High blood sugar that affects pregnant women (gestational diabetes) between 71 and 28 weeks. ? Rh antibodies. This is to check for a protein on red blood cells (Rh factor).  Urine tests to check for infections, diabetes, or protein in the urine.  An ultrasound to confirm the proper growth and development of the baby.  An amniocentesis to check for possible genetic problems.  Fetal  screens for spina bifida and Down syndrome.  HIV (human immunodeficiency virus) testing. Routine prenatal testing includes screening for HIV, unless you choose not to have this test.  Follow these instructions at home: Medicines  Follow your health care provider's instructions regarding medicine use. Specific medicines may be either safe or unsafe to take during pregnancy.  Take a prenatal vitamin that contains at least 600 micrograms (mcg) of folic acid.  If you develop constipation, try taking a stool softener if your health care provider approves. Eating and drinking  Eat a balanced diet that includes fresh fruits and vegetables, whole grains, good sources of protein such as meat, eggs, or tofu, and low-fat dairy. Your health care provider will help you determine the amount of weight gain that is right for you.  Avoid raw meat and uncooked cheese. These carry germs that can cause birth defects in the baby.  If you have low calcium intake from food, talk to your health care provider about whether you should take a daily calcium  supplement.  Limit foods that are high in fat and processed sugars, such as fried and sweet foods.  To prevent constipation: ? Drink enough fluid to keep your urine clear or pale yellow. ? Eat foods that are high in fiber, such as fresh fruits and vegetables, whole grains, and beans. Activity  Exercise only as directed by your health care provider. Most women can continue their usual exercise routine during pregnancy. Try to exercise for 30 minutes at least 5 days a week. Stop exercising if you experience uterine contractions.  Avoid heavy lifting, wear low heel shoes, and practice good posture.  A sexual relationship may be continued unless your health care provider directs you otherwise. Relieving pain and discomfort  Wear a good support bra to prevent discomfort from breast tenderness.  Take warm sitz baths to soothe any pain or discomfort caused by hemorrhoids. Use hemorrhoid cream if your health care provider approves.  Rest with your legs elevated if you have leg cramps or low back pain.  If you develop varicose veins, wear support hose. Elevate your feet for 15 minutes, 3-4 times a day. Limit salt in your diet. Prenatal Care  Write down your questions. Take them to your prenatal visits.  Keep all your prenatal visits as told by your health care provider. This is important. Safety  Wear your seat belt at all times when driving.  Make a list of emergency phone numbers, including numbers for family, friends, the hospital, and police and fire departments. General instructions  Ask your health care provider for a referral to a local prenatal education class. Begin classes no later than the beginning of month 6 of your pregnancy.  Ask for help if you have counseling or nutritional needs during pregnancy. Your health care provider can offer advice or refer you to specialists for help with various needs.  Do not use hot tubs, steam rooms, or saunas.  Do not douche or use  tampons or scented sanitary pads.  Do not cross your legs for long periods of time.  Avoid cat litter boxes and soil used by cats. These carry germs that can cause birth defects in the baby and possibly loss of the fetus by miscarriage or stillbirth.  Avoid all smoking, herbs, alcohol, and unprescribed drugs. Chemicals in these products can affect the formation and growth of the baby.  Do not use any products that contain nicotine or tobacco, such as cigarettes and e-cigarettes. If you need help  quitting, ask your health care provider.  Visit your dentist if you have not gone yet during your pregnancy. Use a soft toothbrush to brush your teeth and be gentle when you floss. Contact a health care provider if:  You have dizziness.  You have mild pelvic cramps, pelvic pressure, or nagging pain in the abdominal area.  You have persistent nausea, vomiting, or diarrhea.  You have a bad smelling vaginal discharge.  You have pain when you urinate. Get help right away if:  You have a fever.  You are leaking fluid from your vagina.  You have spotting or bleeding from your vagina.  You have severe abdominal cramping or pain.  You have rapid weight gain or weight loss.  You have shortness of breath with chest pain.  You notice sudden or extreme swelling of your face, hands, ankles, feet, or legs.  You have not felt your baby move in over an hour.  You have severe headaches that do not go away when you take medicine.  You have vision changes. Summary  The second trimester is from week 14 through week 27 (months 4 through 6). It is also a time when the fetus is growing rapidly.  Your body goes through many changes during pregnancy. The changes vary from woman to woman.  Avoid all smoking, herbs, alcohol, and unprescribed drugs. These chemicals affect the formation and growth your baby.  Do not use any tobacco products, such as cigarettes, chewing tobacco, and e-cigarettes. If you  need help quitting, ask your health care provider.  Contact your health care provider if you have any questions. Keep all prenatal visits as told by your health care provider. This is important. This information is not intended to replace advice given to you by your health care provider. Make sure you discuss any questions you have with your health care provider. Document Released: 05/16/2001 Document Revised: 10/28/2015 Document Reviewed: 07/23/2012 Elsevier Interactive Patient Education  2017 [place] FLU! Because you are pregnant, we at [facility], along with the Centers for Disease Control (CDC), recommend that you receive the flu vaccine to protect yourself and your baby from the flu. The flu is more likely to cause severe illness in pregnant women than in women of reproductive age who are not pregnant. Changes in the immune system, heart, and lungs during pregnancy make pregnant women (and women up to two weeks postpartum) more prone to severe illness from flu, including illness resulting in hospitalization. Flu also may be harmful for a pregnant woman's developing baby. A common flu symptom is fever, which may be associated with neural tube defects and other adverse outcomes for a developing baby. Getting vaccinated can also help protect a baby after birth from flu. (Mom passes antibodies onto the developing baby during her pregnancy.)  A Flu Vaccine is the Best Protection Against Flu Getting a flu vaccine is the first and most important step in protecting against flu. Pregnant women should get a flu shot and not the live attenuated influenza vaccine (LAIV), also known as nasal spray flu vaccine. Flu vaccines given during pregnancy help protect both the mother and her baby from flu. Vaccination has been shown to reduce the risk of flu-associated acute respiratory infection in pregnant women by up to one-half. A 2018 study showed that getting a flu shot  reduced a pregnant woman's risk of being hospitalized with flu by an average of 40 percent. Pregnant women who get a  flu vaccine are also helping to protect their babies from flu illness for the first several months after their birth, when they are too young to get vaccinated.   A Long Record of Safety for Flu Shots in Pregnant Women Flu shots have been given to millions of pregnant women over many years with a good safety record. There is a lot of evidence that flu vaccines can be given safely during pregnancy; though these data are limited for the first trimester. The CDC recommends that pregnant women get vaccinated during any trimester of their pregnancy. It is very important for pregnant women to get the flu shot.   Other Preventive Actions In addition to getting a flu shot, pregnant women should take the same everyday preventive actions the CDC recommends of everyone, including covering coughs, washing hands often, and avoiding people who are sick.  Symptoms and Treatment If you get sick with flu symptoms call your doctor right away. There are antiviral drugs that can treat flu illness and prevent serious flu complications. The CDC recommends prompt treatment for people who have influenza infection or suspected influenza infection and who are at high risk of serious flu complications, such as people with asthma, diabetes (including gestational diabetes), or heart disease. Early treatment of influenza in hospitalized pregnant women has been shown to reduce the length of the hospital stay.  Symptoms Flu symptoms include fever, cough, sore throat, runny or stuffy nose, body aches, headache, chills and fatigue. Some people may also have vomiting and diarrhea. People may be infected with the flu and have respiratory symptoms without a fever.  Early Treatment is Important for Pregnant Women Treatment should begin as soon as possible because antiviral drugs work best when started early (within 48 hours  after symptoms start). Antiviral drugs can make your flu illness milder and make you feel better faster. They may also prevent serious health problems that can result from flu illness. Oral oseltamivir (Tamiflu) is the preferred treatment for pregnant women because it has the most studies available to suggest that it is safe and beneficial. Antiviral drugs require a prescription from your provider. Having a fever caused by flu infection or other infections early in pregnancy may be linked to birth defects in a baby. In addition to taking antiviral drugs, pregnant women who get a fever should treat their fever with Tylenol (acetaminophen) and contact their provider immediately.  When to [place] If you are pregnant and have any of these signs, seek care immediately:  Difficulty breathing or shortness of breath  Pain or pressure in the chest or abdomen  Sudden dizziness  Confusion  Severe or persistent vomiting  High fever that is not responding to Tylenol (or store brand equivalent)  Decreased or no movement of your baby  [email or web address]

## 2019-10-02 NOTE — Progress Notes (Signed)
[place] 19+2 wks,cephalic,anterior placenta gr 0,normal right ovary,left ovary not visualized,svp of fluid 3.6 cm,fhr 148 bpm,cx length 2.6 cm,EFW 308 g 68%,anatomy complete,no obvious abnormalities

## 2019-10-02 NOTE — Progress Notes (Signed)
LOW-RISK PREGNANCY VISIT Patient name: Pamela Gomez MRN [id number]  Date of birth: Dec 01, 1995 Chief Complaint:   Routine Prenatal Visit  History of Present Illness:   Pamela Gomez is a 24 y.o. G1P0 female at [redacted]w[redacted]d with an Estimated Date of Delivery: 02/24/20 being seen today for ongoing management of a low-risk pregnancy.  Depression screen [facility] 2/9 08/14/2019 07/02/2019  Decreased Interest - 0  Down, Depressed, Hopeless 0 0  PHQ - 2 Score 0 0  Altered sleeping 0 -  Tired, decreased energy 0 -  Change in appetite 0 -  Feeling bad or failure about yourself  0 -  Trouble concentrating 0 -  Moving slowly or fidgety/restless 0 -  Suicidal thoughts 0 -  PHQ-9 Score 0 -    Today she reports no complaints. Contractions: Not present. Vag. Bleeding: None.  Movement: Present. denies leaking of fluid. Review of Systems:   Pertinent items are noted in HPI Denies abnormal vaginal discharge w/ itching/odor/irritation, headaches, visual changes, shortness of breath, chest pain, abdominal pain, severe nausea/vomiting, or problems with urination or bowel movements unless otherwise stated above. Pertinent History Reviewed:  Reviewed past medical,surgical, social, obstetrical and family history.  Reviewed problem list, medications and allergies. Physical Assessment:   Vitals:   10/02/19 1112  BP: 107/65  Pulse: 99  Weight: 131 lb 12.8 oz (59.8 kg)  Body mass index is 23.35 kg/m.        Physical Examination:   General appearance: Well appearing, and in no distress  Mental status: Alert, oriented to person, place, and time  Skin: Warm & dry  Cardiovascular: Normal heart rate noted  Respiratory: Normal respiratory effort, no distress  Abdomen: Soft, gravid, nontender  Pelvic: Cervical exam deferred         Extremities: Edema: None  Fetal Status: Fetal Heart Rate (bpm): 148 u/s   Movement: Present   [place] 94+8 wks,cephalic,anterior placenta gr 0,normal right ovary,left ovary not  visualized,svp of fluid 3.6 cm,fhr 148 bpm,cx length 2.6 cm,EFW 308 g 68%,anatomy complete,no obvious abnormalities   Chaperone: n/a    Results for orders placed or performed in visit on 10/02/19 (from the past 24 hour(s))  POC Urinalysis Dipstick OB   Collection Time: 10/02/19 11:10 AM  Result Value Ref Range   Color, UA     Clarity, UA     Glucose, UA Negative Negative   Bilirubin, UA     Ketones, UA neg    Spec Grav, UA     Blood, UA neg    pH, UA     POC,PROTEIN,UA Negative Negative, Trace, Small (1+), Moderate (2+), Large (3+), 4+   Urobilinogen, UA     Nitrite, UA neg    Leukocytes, UA Negative Negative   Appearance     Odor      Assessment & Plan:  1) Low-risk pregnancy G1P0 at [redacted]w[redacted]d with an Estimated Date of Delivery: 02/24/20    Meds: No orders of the defined types were placed in this encounter.  Labs/procedures today: anatomy u/s, 2nd IT  Plan:  Continue routine obstetrical care  Next visit: prefers online    Reviewed: Preterm labor symptoms and general obstetric precautions including but not limited to vaginal bleeding, contractions, leaking of fluid and fetal movement were reviewed in detail with the patient.  All questions were answered. Has home bp cuff. Check bp weekly, let us know if >140/90.   Follow-up: Return in about 4 weeks (around 10/30/2019) for LROB, CNM, MyChart Video.  Orders Placed This Encounter  Procedures  . GC/Chlamydia Probe Amp  . Urine Culture  . INTEGRATED 2  . POC Urinalysis Dipstick OB   Cheral Marker CNM, [facility] 10/02/2019 11:47 AM

## 2019-10-04 LAB — INTEGRATED 2
AFP MoM: 0.85
Alpha-Fetoprotein: 47.5 ng/mL
Crown Rump Length: 60.9 mm
DIA MoM: 0.59
DIA Value: 112.2 pg/mL
Estriol, Unconjugated: 1.57 ng/mL
Gest. Age on Collection Date: 12.4 weeks
Gestational Age: 19.4 weeks
Maternal Age at EDD: 22.7 yr
Nuchal Translucency (NT): 1.3 mm
Nuchal Translucency MoM: 0.96
Number of Fetuses: 1
PAPP-A MoM: 0.63
PAPP-A Value: 679.9 ng/mL
Test Results:: NEGATIVE
Weight: 129 [lb_av]
Weight: 129 [lb_av]
hCG MoM: 0.83
hCG Value: 20.1 IU/mL
uE3 MoM: 0.78

## 2019-10-04 LAB — URINE CULTURE

## 2019-10-04 LAB — GC/CHLAMYDIA PROBE AMP
Chlamydia trachomatis, NAA: NEGATIVE
Neisseria Gonorrhoeae by PCR: NEGATIVE

## 2019-10-27 ENCOUNTER — Encounter: Payer: Self-pay | Admitting: *Deleted

## 2019-10-28 ENCOUNTER — Encounter: Payer: Self-pay | Admitting: Obstetrics & Gynecology

## 2019-10-28 ENCOUNTER — Telehealth (INDEPENDENT_AMBULATORY_CARE_PROVIDER_SITE_OTHER): Payer: BC Managed Care – PPO | Admitting: Obstetrics & Gynecology

## 2019-10-28 VITALS — BP 117/67 | HR 90

## 2019-10-28 DIAGNOSIS — Z3402 Encounter for supervision of normal first pregnancy, second trimester: Secondary | ICD-10-CM

## 2019-10-28 DIAGNOSIS — Z3A23 23 weeks gestation of pregnancy: Secondary | ICD-10-CM

## 2019-10-28 NOTE — Progress Notes (Signed)
   TELEHEALTH OBSTETRICS VISIT ENCOUNTER NOTE  I connected with Pamela Gomez on 10/28/19 at  4:10 PM EDT by telephone at home and verified that I am speaking with the correct person using two identifiers.   I discussed the limitations, risks, security and privacy concerns of performing an evaluation and management service by telephone and the availability of in person appointments. I also discussed with the patient that there may be a patient responsible charge related to this service. The patient expressed understanding and agreed to proceed.  Subjective:  Pamela Gomez is a 24 y.o. G1P0 at [redacted]w[redacted]d being followed for ongoing prenatal care.  She is currently monitored for the following issues for this low-risk pregnancy and has Supervision of normal first pregnancy on their problem list.  Patient reports no complaints. Reports fetal movement. Denies any contractions, bleeding or leaking of fluid.   The following portions of the patient's history were reviewed and updated as appropriate: allergies, current medications, past family history, past medical history, past social history, past surgical history and problem list.   Objective:   General:  Alert, oriented and cooperative.   Mental Status: Normal mood and affect perceived. Normal judgment and thought content.  Rest of physical exam deferred due to type of encounter  Assessment and Plan:  Pregnancy: G1P0 at [redacted]w[redacted]d There are no diagnoses linked to this encounter. Preterm labor symptoms and general obstetric precautions including but not limited to vaginal bleeding, contractions, leaking of fluid and fetal movement were reviewed in detail with the patient.  I discussed the assessment and treatment plan with the patient. The patient was provided an opportunity to ask questions and all were answered. The patient agreed with the plan and demonstrated an understanding of the instructions. The patient was advised to call back or seek an in-person  office evaluation/go to [facility] at [facility] for any urgent or concerning symptoms. Please refer to After Visit Summary for other counseling recommendations.   I provided 11 minutes of non-face-to-face time during this encounter.  Return in about 4 weeks (around 11/25/2019) for PN2, LROB.  No future appointments.  Lazaro Arms, MD Center for [employer], [facility]

## 2019-10-30 ENCOUNTER — Telehealth: Payer: BC Managed Care – PPO | Admitting: Women's Health

## 2019-11-25 ENCOUNTER — Other Ambulatory Visit: Payer: BC Managed Care – PPO

## 2019-11-25 ENCOUNTER — Ambulatory Visit (INDEPENDENT_AMBULATORY_CARE_PROVIDER_SITE_OTHER): Payer: BC Managed Care – PPO | Admitting: Women's Health

## 2019-11-25 ENCOUNTER — Encounter: Payer: Self-pay | Admitting: Women's Health

## 2019-11-25 VITALS — BP 104/62 | HR 96 | Wt 145.0 lb

## 2019-11-25 DIAGNOSIS — Z1389 Encounter for screening for other disorder: Secondary | ICD-10-CM

## 2019-11-25 DIAGNOSIS — O26842 Uterine size-date discrepancy, second trimester: Secondary | ICD-10-CM

## 2019-11-25 DIAGNOSIS — Z3A27 27 weeks gestation of pregnancy: Secondary | ICD-10-CM

## 2019-11-25 DIAGNOSIS — Z3402 Encounter for supervision of normal first pregnancy, second trimester: Secondary | ICD-10-CM

## 2019-11-25 DIAGNOSIS — Z331 Pregnant state, incidental: Secondary | ICD-10-CM

## 2019-11-25 DIAGNOSIS — Z23 Encounter for immunization: Secondary | ICD-10-CM

## 2019-11-25 DIAGNOSIS — Z131 Encounter for screening for diabetes mellitus: Secondary | ICD-10-CM

## 2019-11-25 LAB — POCT URINALYSIS DIPSTICK OB
Blood, UA: NEGATIVE
Glucose, UA: NEGATIVE
Ketones, UA: NEGATIVE
Leukocytes, UA: NEGATIVE
Nitrite, UA: NEGATIVE

## 2019-11-25 NOTE — Patient Instructions (Signed)
Pamela Gomez, I greatly value your feedback.  If you receive a survey following your visit with [place] today, we appreciate you taking the time to fill it out.  Thanks, Joellyn Haff, CNM, WHNP-BC   [facility] ([address] [place], [place] [address]) Entrance C, located off of [address] valet parking  Go to [email or web address] to register for FREE online childbirth classes   Call the office [phone]) or go to [facility] if:  You begin to have strong, frequent contractions  Your water breaks.  Sometimes it is a big gush of fluid, sometimes it is just a trickle that keeps getting your panties wet or running down your legs  You have vaginal bleeding.  It is normal to have a small amount of spotting if your cervix was checked.   You don't feel your baby moving like normal.  If you don't, get you something to eat and drink and lay down and focus on feeling your baby move.  You should feel at least 10 movements in 2 hours.  If you don't, you should call the office or go to [facility].    Tdap Vaccine  It is recommended that you get the Tdap vaccine during the third trimester of EACH pregnancy to help protect your baby from getting pertussis (whooping cough)  27-36 weeks is the BEST time to do this so that you can pass the protection on to your baby. During pregnancy is better than after pregnancy, but if you are unable to get it during pregnancy it will be offered at the hospital.   You can get this vaccine with [place], at the health department, your family doctor, or some local pharmacies  Everyone who will be around your baby should also be up-to-date on their vaccines before the baby comes. Adults (who are not pregnant) only need 1 dose of Tdap during adulthood.   [place] Pediatricians/Family Doctors:  [facility] [phone]            [facility] [phone]                 [facility]  [phone] (usually not accepting new patients unless you have family there already, you are always welcome to call and ask)       [facility] Department [phone]       [facility] Pediatricians/Family Doctors:   [facility]: [phone]  [facility]: [phone]  [facility]: [phone]  [facility] Doctors:   [facility]: [phone]   [facility]: [phone]  [facility] Doctors:  [facility]: [phone]   Home Blood Pressure Monitoring for Patients   Your provider has recommended that you check your blood pressure (BP) at least once a week at home. If you do not have a blood pressure cuff at home, one will be provided for you. Contact your provider if you have not received your monitor within 1 week.   Helpful Tips for Accurate Home Blood Pressure Checks  . Don't smoke, exercise, or drink caffeine 30 minutes before checking your BP . Use the restroom before checking your BP (a full bladder can raise your pressure) . Relax in a comfortable upright chair . Feet on the ground . Left arm resting comfortably on a flat surface at the level of your heart . Legs uncrossed . Back supported . Sit quietly and don't talk . Place the cuff on your  bare arm . Adjust snuggly, so that only two fingertips can fit between your skin and the top of the cuff . Check 2 readings separated by at least one minute . Keep a log of your BP readings . For a visual, please reference this diagram: http://ccnc.care/bpdiagram  Provider Name: [facility]     Phone: [phone]  Zone 1: ALL CLEAR  Continue to monitor your symptoms:  . BP reading is less than 140 (top number) or less than 90 (bottom number)  . No right upper stomach pain . No headaches or seeing spots . No feeling nauseated or throwing up . No swelling in face and hands  Zone 2: CAUTION Call your  doctor's office for any of the following:  . BP reading is greater than 140 (top number) or greater than 90 (bottom number)  . Stomach pain under your ribs in the middle or right side . Headaches or seeing spots . Feeling nauseated or throwing up . Swelling in face and hands  Zone 3: EMERGENCY  Seek immediate medical care if you have any of the following:  . BP reading is greater than160 (top number) or greater than 110 (bottom number) . Severe headaches not improving with Tylenol . Serious difficulty catching your breath . Any worsening symptoms from Zone 2   Third Trimester of Pregnancy The third trimester is from week 29 through week 42, months 7 through 9. The third trimester is a time when the fetus is growing rapidly. At the end of the ninth month, the fetus is about 20 inches in length and weighs 6-10 pounds.  BODY CHANGES Your body goes through many changes during pregnancy. The changes vary from woman to woman.   Your weight will continue to increase. You can expect to gain 25-35 pounds (11-16 kg) by the end of the pregnancy.  You may begin to get stretch marks on your hips, abdomen, and breasts.  You may urinate more often because the fetus is moving lower into your pelvis and pressing on your bladder.  You may develop or continue to have heartburn as a result of your pregnancy.  You may develop constipation because certain hormones are causing the muscles that push waste through your intestines to slow down.  You may develop hemorrhoids or swollen, bulging veins (varicose veins).  You may have pelvic pain because of the weight gain and pregnancy hormones relaxing your joints between the bones in your pelvis. Backaches may result from overexertion of the muscles supporting your posture.  You may have changes in your hair. These can include thickening of your hair, rapid growth, and changes in texture. Some women also have hair loss during or after pregnancy, or hair that  feels dry or thin. Your hair will most likely return to normal after your baby is born.  Your breasts will continue to grow and be tender. A yellow discharge may leak from your breasts called colostrum.  Your belly button may stick out.  You may feel short of breath because of your expanding uterus.  You may notice the fetus "dropping," or moving lower in your abdomen.  You may have a bloody mucus discharge. This usually occurs a few days to a week before labor begins.  Your cervix becomes thin and soft (effaced) near your due date. WHAT TO EXPECT AT YOUR PRENATAL EXAMS  You will have prenatal exams every 2 weeks until week 36. Then, you will have weekly prenatal exams. During a routine prenatal visit:  You will be weighed to make sure you and the fetus are growing normally.  Your blood pressure is taken.  Your abdomen will be measured to track your baby's growth.  The fetal heartbeat will be listened to.  Any test results from the previous visit will be discussed.  You may have a cervical check near your due date to see if you have effaced. At around 36 weeks, your caregiver will check your cervix. At the same time, your caregiver will also perform a test on the secretions of the vaginal tissue. This test is to determine if a type of bacteria, Group B streptococcus, is present. Your caregiver will explain this further. Your caregiver may ask you:  What your birth plan is.  How you are feeling.  If you are feeling the baby move.  If you have had any abnormal symptoms, such as leaking fluid, bleeding, severe headaches, or abdominal cramping.  If you have any questions. Other tests or screenings that may be performed during your third trimester include:  Blood tests that check for low iron levels (anemia).  Fetal testing to check the health, activity level, and growth of the fetus. Testing is done if you have certain medical conditions or if there are problems during the  pregnancy. FALSE LABOR You may feel small, irregular contractions that eventually go away. These are called Braxton Hicks contractions, or false labor. Contractions may last for hours, days, or even weeks before true labor sets in. If contractions come at regular intervals, intensify, or become painful, it is best to be seen by your caregiver.  SIGNS OF LABOR   Menstrual-like cramps.  Contractions that are 5 minutes apart or less.  Contractions that start on the top of the uterus and spread down to the lower abdomen and back.  A sense of increased pelvic pressure or back pain.  A watery or bloody mucus discharge that comes from the vagina. If you have any of these signs before the 37th week of pregnancy, call your caregiver right away. You need to go to the hospital to get checked immediately. HOME CARE INSTRUCTIONS   Avoid all smoking, herbs, alcohol, and unprescribed drugs. These chemicals affect the formation and growth of the baby.  Follow your caregiver's instructions regarding medicine use. There are medicines that are either safe or unsafe to take during pregnancy.  Exercise only as directed by your caregiver. Experiencing uterine cramps is a good sign to stop exercising.  Continue to eat regular, healthy meals.  Wear a good support bra for breast tenderness.  Do not use hot tubs, steam rooms, or saunas.  Wear your seat belt at all times when driving.  Avoid raw meat, uncooked cheese, cat litter boxes, and soil used by cats. These carry germs that can cause birth defects in the baby.  Take your prenatal vitamins.  Try taking a stool softener (if your caregiver approves) if you develop constipation. Eat more high-fiber foods, such as fresh vegetables or fruit and whole grains. Drink plenty of fluids to keep your urine clear or pale yellow.  Take warm sitz baths to soothe any pain or discomfort caused by hemorrhoids. Use hemorrhoid cream if your caregiver approves.  If you  develop varicose veins, wear support hose. Elevate your feet for 15 minutes, 3-4 times a day. Limit salt in your diet.  Avoid heavy lifting, wear low heal shoes, and practice good posture.  Rest a lot with your legs elevated if you have leg cramps or low  back pain.  Visit your dentist if you have not gone during your pregnancy. Use a soft toothbrush to brush your teeth and be gentle when you floss.  A sexual relationship may be continued unless your caregiver directs you otherwise.  Do not travel far distances unless it is absolutely necessary and only with the approval of your caregiver.  Take prenatal classes to understand, practice, and ask questions about the labor and delivery.  Make a trial run to the hospital.  Pack your hospital bag.  Prepare the baby's nursery.  Continue to go to all your prenatal visits as directed by your caregiver. SEEK MEDICAL CARE IF:  You are unsure if you are in labor or if your water has broken.  You have dizziness.  You have mild pelvic cramps, pelvic pressure, or nagging pain in your abdominal area.  You have persistent nausea, vomiting, or diarrhea.  You have a bad smelling vaginal discharge.  You have pain with urination. SEEK IMMEDIATE MEDICAL CARE IF:   You have a fever.  You are leaking fluid from your vagina.  You have spotting or bleeding from your vagina.  You have severe abdominal cramping or pain.  You have rapid weight loss or gain.  You have shortness of breath with chest pain.  You notice sudden or extreme swelling of your face, hands, ankles, feet, or legs.  You have not felt your baby move in over an hour.  You have severe headaches that do not go away with medicine.  You have vision changes. Document Released: 05/16/2001 Document Revised: 05/27/2013 Document Reviewed: 07/23/2012 [facility] Patient Information 2015 [place], [place]. This information is not intended to replace advice given to you by your health  care provider. Make sure you discuss any questions you have with your health care provider.

## 2019-11-25 NOTE — Progress Notes (Signed)
LOW-RISK PREGNANCY VISIT Patient name: Pamela Gomez MRN [id number]  Date of birth: 1996/04/25 Chief Complaint:   Routine Prenatal Visit (PN2)  History of Present Illness:   Pamela Gomez is a 24 y.o. G1P0 female at [redacted]w[redacted]d with an Estimated Date of Delivery: 02/24/20 being seen today for ongoing management of a low-risk pregnancy.  Depression screen [facility] 2/9 11/25/2019 08/14/2019 07/02/2019  Decreased Interest 0 - 0  Down, Depressed, Hopeless 0 0 0  PHQ - 2 Score 0 0 0  Altered sleeping 0 0 -  Tired, decreased energy 0 0 -  Change in appetite 0 0 -  Feeling bad or failure about yourself  0 0 -  Trouble concentrating 0 0 -  Moving slowly or fidgety/restless 0 0 -  Suicidal thoughts 0 0 -  PHQ-9 Score 0 0 -    Today she reports no complaints. Contractions: Not present.  .  Movement: Present. denies leaking of fluid. Review of Systems:   Pertinent items are noted in HPI Denies abnormal vaginal discharge w/ itching/odor/irritation, headaches, visual changes, shortness of breath, chest pain, abdominal pain, severe nausea/vomiting, or problems with urination or bowel movements unless otherwise stated above. Pertinent History Reviewed:  Reviewed past medical,surgical, social, obstetrical and family history.  Reviewed problem list, medications and allergies. Physical Assessment:   Vitals:   11/25/19 0923  BP: 104/62  Pulse: 96  Weight: 145 lb (65.8 kg)  Body mass index is 25.69 kg/m.        Physical Examination:   General appearance: Well appearing, and in no distress  Mental status: Alert, oriented to person, place, and time  Skin: Warm & dry  Cardiovascular: Normal heart rate noted  Respiratory: Normal respiratory effort, no distress  Abdomen: Soft, gravid, nontender  Pelvic: Cervical exam deferred         Extremities: Edema: None  Fetal Status: Fetal Heart Rate (bpm): 135 Fundal Height: 23 cm Movement: Present    Chaperone: n/a    Results for orders placed or performed in  visit on 11/25/19 (from the past 24 hour(s))  POC Urinalysis Dipstick OB   Collection Time: 11/25/19  9:27 AM  Result Value Ref Range   Color, UA     Clarity, UA     Glucose, UA Negative Negative   Bilirubin, UA     Ketones, UA neg    Spec Grav, UA     Blood, UA neg    pH, UA     POC,PROTEIN,UA Trace Negative, Trace, Small (1+), Moderate (2+), Large (3+), 4+   Urobilinogen, UA     Nitrite, UA neg    Leukocytes, UA Negative Negative   Appearance     Odor      Assessment & Plan:  1) Low-risk pregnancy G1P0 at [redacted]w[redacted]d with an Estimated Date of Delivery: 02/24/20   2) Size <dates, will get efw/afi u/s   Meds: No orders of the defined types were placed in this encounter.  Labs/procedures today: pn2, tdap  Plan:  Continue routine obstetrical care  Next visit: prefers in person    Reviewed: Preterm labor symptoms and general obstetric precautions including but not limited to vaginal bleeding, contractions, leaking of fluid and fetal movement were reviewed in detail with the patient.  All questions were answered. Has access to home bp cuff (uses her sister's). Check bp weekly, let us know if >140/90.   Follow-up: Return in about 4 weeks (around 12/23/2019) for LROB, CNM; ASAP efw u/s.  Orders Placed  This Encounter  Procedures  . US OB Follow Up  . Tdap vaccine greater than or equal to 7yo IM  . POC Urinalysis Dipstick OB   Roma Schanz CNM, [facility] 11/25/2019 10:08 AM

## 2019-11-26 LAB — CBC
Hematocrit: 35.4 % (ref 34.0–46.6)
Hemoglobin: 12.2 g/dL (ref 11.1–15.9)
MCH: 31.2 pg (ref 26.6–33.0)
MCHC: 34.5 g/dL (ref 31.5–35.7)
MCV: 91 fL (ref 79–97)
Platelets: 209 10*3/uL (ref 150–450)
RBC: 3.91 x10E6/uL (ref 3.77–5.28)
RDW: 12.5 % (ref 11.7–15.4)
WBC: 8.9 10*3/uL (ref 3.4–10.8)

## 2019-11-26 LAB — RPR: RPR Ser Ql: NONREACTIVE

## 2019-11-26 LAB — GLUCOSE TOLERANCE, 2 HOURS W/ 1HR
Glucose, 1 hour: 55 mg/dL — ABNORMAL LOW (ref 65–179)
Glucose, 2 hour: 69 mg/dL (ref 65–152)
Glucose, Fasting: 78 mg/dL (ref 65–91)

## 2019-11-26 LAB — ANTIBODY SCREEN: Antibody Screen: NEGATIVE

## 2019-11-26 LAB — HIV ANTIBODY (ROUTINE TESTING W REFLEX): HIV Screen 4th Generation wRfx: NONREACTIVE

## 2019-11-28 ENCOUNTER — Other Ambulatory Visit: Payer: Self-pay

## 2019-11-28 ENCOUNTER — Ambulatory Visit (INDEPENDENT_AMBULATORY_CARE_PROVIDER_SITE_OTHER): Payer: BC Managed Care – PPO

## 2019-11-28 DIAGNOSIS — O26842 Uterine size-date discrepancy, second trimester: Secondary | ICD-10-CM

## 2019-11-28 DIAGNOSIS — Z3402 Encounter for supervision of normal first pregnancy, second trimester: Secondary | ICD-10-CM

## 2019-11-28 DIAGNOSIS — Z3A27 27 weeks gestation of pregnancy: Secondary | ICD-10-CM | POA: Diagnosis not present

## 2019-11-28 NOTE — Progress Notes (Signed)
[place] 27+3 wks,cephalic,anterior placenta gr 1,cx 3.2 cm,afi 10.4 cm,fhr 146 bpm,efw 1063 g 35%

## 2019-12-23 ENCOUNTER — Ambulatory Visit (INDEPENDENT_AMBULATORY_CARE_PROVIDER_SITE_OTHER): Payer: BC Managed Care – PPO | Admitting: Women's Health

## 2019-12-23 ENCOUNTER — Encounter: Payer: Self-pay | Admitting: Women's Health

## 2019-12-23 VITALS — BP 100/61 | HR 105 | Wt 146.0 lb

## 2019-12-23 DIAGNOSIS — Z331 Pregnant state, incidental: Secondary | ICD-10-CM

## 2019-12-23 DIAGNOSIS — Z1389 Encounter for screening for other disorder: Secondary | ICD-10-CM

## 2019-12-23 DIAGNOSIS — Z3403 Encounter for supervision of normal first pregnancy, third trimester: Secondary | ICD-10-CM

## 2019-12-23 DIAGNOSIS — Z3A31 31 weeks gestation of pregnancy: Secondary | ICD-10-CM

## 2019-12-23 LAB — POCT URINALYSIS DIPSTICK OB
Blood, UA: NEGATIVE
Glucose, UA: NEGATIVE
Ketones, UA: NEGATIVE
Leukocytes, UA: NEGATIVE
Nitrite, UA: NEGATIVE
POC,PROTEIN,UA: NEGATIVE

## 2019-12-23 NOTE — Progress Notes (Signed)
LOW-RISK PREGNANCY VISIT Patient name: Pamela Gomez MRN [id number]  Date of birth: April 15, 1996 Chief Complaint:   Routine Prenatal Visit  History of Present Illness:   Pamela Gomez is a 24 y.o. G1P0 female at [redacted]w[redacted]d with an Estimated Date of Delivery: 02/24/20 being seen today for ongoing management of a low-risk pregnancy.  Depression screen [facility] 2/9 11/25/2019 08/14/2019 07/02/2019  Decreased Interest 0 - 0  Down, Depressed, Hopeless 0 0 0  PHQ - 2 Score 0 0 0  Altered sleeping 0 0 -  Tired, decreased energy 0 0 -  Change in appetite 0 0 -  Feeling bad or failure about yourself  0 0 -  Trouble concentrating 0 0 -  Moving slowly or fidgety/restless 0 0 -  Suicidal thoughts 0 0 -  PHQ-9 Score 0 0 -    Today she reports irritation, sore in belly button- does have piercing. Contractions: Not present. Vag. Bleeding: None.  Movement: Present. denies leaking of fluid. Review of Systems:   Pertinent items are noted in HPI Denies abnormal vaginal discharge w/ itching/odor/irritation, headaches, visual changes, shortness of breath, chest pain, abdominal pain, severe nausea/vomiting, or problems with urination or bowel movements unless otherwise stated above. Pertinent History Reviewed:  Reviewed past medical,surgical, social, obstetrical and family history.  Reviewed problem list, medications and allergies. Physical Assessment:   Vitals:   12/23/19 0844  BP: 100/61  Pulse: (!) 105  Weight: 146 lb (66.2 kg)  Body mass index is 25.86 kg/m.        Physical Examination:   General appearance: Well appearing, and in no distress  Mental status: Alert, oriented to person, place, and time  Skin: Warm & dry  Cardiovascular: Normal heart rate noted  Respiratory: Normal respiratory effort, no distress  Abdomen: Soft, gravid, nontender, small irritation/sore in umbilicus  Pelvic: Cervical exam deferred         Extremities: Edema: None  Fetal Status: Fetal Heart Rate (bpm): 135 Fundal  Height: 26 cm Movement: Present    Chaperone: n/a    Results for orders placed or performed in visit on 12/23/19 (from the past 24 hour(s))  POC Urinalysis Dipstick OB   Collection Time: 12/23/19  8:40 AM  Result Value Ref Range   Color, UA     Clarity, UA     Glucose, UA Negative Negative   Bilirubin, UA     Ketones, UA n    Spec Grav, UA     Blood, UA n    pH, UA     POC,PROTEIN,UA Negative Negative, Trace, Small (1+), Moderate (2+), Large (3+), 4+   Urobilinogen, UA     Nitrite, UA n    Leukocytes, UA Negative Negative   Appearance     Odor      Assessment & Plan:  1) Low-risk pregnancy G1P0 at [redacted]w[redacted]d with an Estimated Date of Delivery: 02/24/20   2) Size <dates, EFW 31% w/ normal AFI @ 27wks, will reassess next visit, if FH still low will repeat EFW  3) Sore in umbilicus> keep clean, can try hydrocortisone, let us know if worsening/not improving   Meds: No orders of the defined types were placed in this encounter.  Labs/procedures today: none  Plan:  Continue routine obstetrical care  Next visit: prefers in person    Reviewed: Preterm labor symptoms and general obstetric precautions including but not limited to vaginal bleeding, contractions, leaking of fluid and fetal movement were reviewed in detail with the patient.  All questions were answered.   Follow-up: Return in about 2 weeks (around 01/06/2020) for LROB, CNM, in person.  Orders Placed This Encounter  Procedures  . POC Urinalysis Dipstick OB   Cheral Marker CNM, [facility] 12/23/2019 9:07 AM

## 2019-12-23 NOTE — Patient Instructions (Signed)
Pamela Gomez, I greatly value your feedback.  If you receive a survey following your visit with us today, we appreciate you taking the time to fill it out.  Thanks, Kim Thai Burgueno, CNM, WHNP-BC  [facility] ([address] [place], [place] [address]) Entrance C, located off of [address] Free 24/7 valet parking   Go to [email or web address] to register for FREE online childbirth classes    Call the office ([phone]) or go to [facility] if:  You begin to have strong, frequent contractions  Your water breaks.  Sometimes it is a big gush of fluid, sometimes it is just a trickle that keeps getting your panties wet or running down your legs  You have vaginal bleeding.  It is normal to have a small amount of spotting if your cervix was checked.   You don't feel your baby moving like normal.  If you don't, get you something to eat and drink and lay down and focus on feeling your baby move.  You should feel at least 10 movements in 2 hours.  If you don't, you should call the office or go to [facility].   Call the office ([phone]) or go to [facility] for these signs of pre-eclampsia:  Severe headache that does not go away with Tylenol  Visual changes- seeing spots, double, blurred vision  Pain under your right breast or upper abdomen that does not go away with Tums or heartburn medicine  Nausea and/or vomiting  Severe swelling in your hands, feet, and face    Home Blood Pressure Monitoring for Patients   Your provider has recommended that you check your blood pressure (BP) at least once a week at home. If you do not have a blood pressure cuff at home, one will be provided for you. Contact your provider if you have not received your monitor within 1 week.   Helpful Tips for Accurate Home Blood Pressure Checks  . Don't smoke, exercise, or drink caffeine 30 minutes before checking your BP . Use the restroom before checking your BP (a full bladder  can raise your pressure) . Relax in a comfortable upright chair . Feet on the ground . Left arm resting comfortably on a flat surface at the level of your heart . Legs uncrossed . Back supported . Sit quietly and don't talk . Place the cuff on your bare arm . Adjust snuggly, so that only two fingertips can fit between your skin and the top of the cuff . Check 2 readings separated by at least one minute . Keep a log of your BP readings . For a visual, please reference this diagram: http://ccnc.care/bpdiagram  Provider Name: [facility]     Phone: [phone]  Zone 1: ALL CLEAR  Continue to monitor your symptoms:  . BP reading is less than 140 (top number) or less than 90 (bottom number)  . No right upper stomach pain . No headaches or seeing spots . No feeling nauseated or throwing up . No swelling in face and hands  Zone 2: CAUTION Call your doctor's office for any of the following:  . BP reading is greater than 140 (top number) or greater than 90 (bottom number)  . Stomach pain under your ribs in the middle or right side . Headaches or seeing spots . Feeling nauseated or throwing up . Swelling in face and hands  Zone 3: EMERGENCY  Seek immediate medical care if you have any of   the following:  . BP reading is greater than160 (top number) or greater than 110 (bottom number) . Severe headaches not improving with Tylenol . Serious difficulty catching your breath . Any worsening symptoms from Zone 2  Preterm Labor and Birth Information  The normal length of a pregnancy is 39-41 weeks. Preterm labor is when labor starts before 37 completed weeks of pregnancy. What are the risk factors for preterm labor? Preterm labor is more likely to occur in women who:  Have certain infections during pregnancy such as a bladder infection, sexually transmitted infection, or infection inside the uterus (chorioamnionitis).  Have a shorter-than-normal cervix.  Have gone into preterm  labor before.  Have had surgery on their cervix.  Are younger than age 24 or older than age 36.  Are African American.  Are pregnant with twins or multiple babies (multiple gestation).  Take street drugs or smoke while pregnant.  Do not gain enough weight while pregnant.  Became pregnant shortly after having been pregnant. What are the symptoms of preterm labor? Symptoms of preterm labor include:  Cramps similar to those that can happen during a menstrual period. The cramps may happen with diarrhea.  Pain in the abdomen or lower back.  Regular uterine contractions that may feel like tightening of the abdomen.  A feeling of increased pressure in the pelvis.  Increased watery or bloody mucus discharge from the vagina.  Water breaking (ruptured amniotic sac). Why is it important to recognize signs of preterm labor? It is important to recognize signs of preterm labor because babies who are born prematurely may not be fully developed. This can put them at an increased risk for:  Long-term (chronic) heart and lung problems.  Difficulty immediately after birth with regulating body systems, including blood sugar, body temperature, heart rate, and breathing rate.  Bleeding in the brain.  Cerebral palsy.  Learning difficulties.  Death. These risks are highest for babies who are born before 43 weeks of pregnancy. How is preterm labor treated? Treatment depends on the length of your pregnancy, your condition, and the health of your baby. It may involve: 1. Having a stitch (suture) placed in your cervix to prevent your cervix from opening too early (cerclage). 2. Taking or being given medicines, such as: ? Hormone medicines. These may be given early in pregnancy to help support the pregnancy. ? Medicine to stop contractions. ? Medicines to help mature the baby's lungs. These may be prescribed if the risk of delivery is high. ? Medicines to prevent your baby from developing  cerebral palsy. If the labor happens before 34 weeks of pregnancy, you may need to stay in the hospital. What should I do if I think I am in preterm labor? If you think that you are going into preterm labor, call your health care provider right away. How can I prevent preterm labor in future pregnancies? To increase your chance of having a full-term pregnancy:  Do not use any tobacco products, such as cigarettes, chewing tobacco, and e-cigarettes. If you need help quitting, ask your health care provider.  Do not use street drugs or medicines that have not been prescribed to you during your pregnancy.  Talk with your health care provider before taking any herbal supplements, even if you have been taking them regularly.  Make sure you gain a healthy amount of weight during your pregnancy.  Watch for infection. If you think that you might have an infection, get it checked right away.  Make sure to  tell your health care provider if you have gone into preterm labor before. This information is not intended to replace advice given to you by your health care provider. Make sure you discuss any questions you have with your health care provider. Document Revised: 09/13/2018 Document Reviewed: 10/13/2015 Elsevier Patient Education  [place].

## 2019-12-30 ENCOUNTER — Encounter: Payer: Self-pay | Admitting: Obstetrics & Gynecology

## 2019-12-30 ENCOUNTER — Ambulatory Visit (INDEPENDENT_AMBULATORY_CARE_PROVIDER_SITE_OTHER): Payer: BC Managed Care – PPO | Admitting: Obstetrics & Gynecology

## 2019-12-30 ENCOUNTER — Encounter: Payer: Self-pay | Admitting: *Deleted

## 2019-12-30 VITALS — BP 110/71 | HR 107 | Wt 149.0 lb

## 2019-12-30 DIAGNOSIS — Z3A32 32 weeks gestation of pregnancy: Secondary | ICD-10-CM

## 2019-12-30 DIAGNOSIS — Z1389 Encounter for screening for other disorder: Secondary | ICD-10-CM | POA: Diagnosis not present

## 2019-12-30 DIAGNOSIS — Z3403 Encounter for supervision of normal first pregnancy, third trimester: Secondary | ICD-10-CM

## 2019-12-30 DIAGNOSIS — Z331 Pregnant state, incidental: Secondary | ICD-10-CM

## 2019-12-30 LAB — POCT URINALYSIS DIPSTICK OB
Blood, UA: NEGATIVE
Glucose, UA: NEGATIVE
Ketones, UA: NEGATIVE
Leukocytes, UA: NEGATIVE
Nitrite, UA: NEGATIVE
POC,PROTEIN,UA: NEGATIVE

## 2019-12-30 MED ORDER — CYCLOBENZAPRINE HCL 10 MG PO TABS
10.0000 mg | ORAL_TABLET | Freq: Three times a day (TID) | ORAL | 1 refills | Status: DC | PRN
Start: 2019-12-30 — End: 2020-03-03

## 2019-12-30 NOTE — Progress Notes (Signed)
LOW-RISK PREGNANCY VISIT Patient name: Pamela Gomez MRN [id number]  Date of birth: 01-Feb-1996 Chief Complaint:   Routine Prenatal Visit (Work in pain on right side)  History of Present Illness:   Pamela Gomez is a 24 y.o. G1P0 female at [redacted]w[redacted]d with an Estimated Date of Delivery: 02/24/20 being seen today for ongoing management of a low-risk pregnancy.  Depression screen [facility] 2/9 11/25/2019 08/14/2019 07/02/2019  Decreased Interest 0 - 0  Down, Depressed, Hopeless 0 0 0  PHQ - 2 Score 0 0 0  Altered sleeping 0 0 -  Tired, decreased energy 0 0 -  Change in appetite 0 0 -  Feeling bad or failure about yourself  0 0 -  Trouble concentrating 0 0 -  Moving slowly or fidgety/restless 0 0 -  Suicidal thoughts 0 0 -  PHQ-9 Score 0 0 -    Today she reports backache. Contractions: Not present. Vag. Bleeding: None.  Movement: Present. denies leaking of fluid. Review of Systems:   Pertinent items are noted in HPI Denies abnormal vaginal discharge w/ itching/odor/irritation, headaches, visual changes, shortness of breath, chest pain, abdominal pain, severe nausea/vomiting, or problems with urination or bowel movements unless otherwise stated above. Pertinent History Reviewed:  Reviewed past medical,surgical, social, obstetrical and family history.  Reviewed problem list, medications and allergies. Physical Assessment:   Vitals:   12/30/19 1508  BP: 110/71  Pulse: (!) 107  Weight: 149 lb (67.6 kg)  Body mass index is 26.39 kg/m.        Physical Examination:   General appearance: Well appearing, and in no distress  Mental status: Alert, oriented to person, place, and time  Skin: Warm & dry  Cardiovascular: Normal heart rate noted  Respiratory: Normal respiratory effort, no distress  Abdomen: Soft, gravid, nontender  Pelvic: Cervical exam deferred         Extremities: Edema: None  Fetal Status: Fetal Heart Rate (bpm): 143 Fundal Height: 31 cm Movement: Present    Chaperone: n/a     Results for orders placed or performed in visit on 12/30/19 (from the past 24 hour(s))  POC Urinalysis Dipstick OB   Collection Time: 12/30/19  3:12 PM  Result Value Ref Range   Color, UA     Clarity, UA     Glucose, UA Negative Negative   Bilirubin, UA     Ketones, UA neg    Spec Grav, UA     Blood, UA neg    pH, UA     POC,PROTEIN,UA Negative Negative, Trace, Small (1+), Moderate (2+), Large (3+), 4+   Urobilinogen, UA     Nitrite, UA neg    Leukocytes, UA Negative Negative   Appearance     Odor      Assessment & Plan:  1) Low-risk pregnancy G1P0 at [redacted]w[redacted]d with an Estimated Date of Delivery: 02/24/20   2) right PSIS muscle spasm, trigger points, declines ,    Meds:  Meds ordered this encounter  Medications  . cyclobenzaprine (FLEXERIL) 10 MG tablet    Sig: Take 1 tablet (10 mg total) by mouth every 8 (eight) hours as needed for muscle spasms.    Dispense:  30 tablet    Refill:  1   Labs/procedures today:   Plan:  Continue routine obstetrical care  Next visit: prefers in person    Reviewed: Preterm labor symptoms and general obstetric precautions including but not limited to vaginal bleeding, contractions, leaking of fluid and fetal movement were reviewed  in detail with the patient.  All questions were answered. has home bp cuff. Rx faxed to . Check bp weekly, let us know if >140/90.   Follow-up: Return for keep scheduled.  Orders Placed This Encounter  Procedures  . POC Urinalysis Dipstick OB   Lazaro Arms  12/30/2019 3:47 PM

## 2020-01-06 ENCOUNTER — Ambulatory Visit (INDEPENDENT_AMBULATORY_CARE_PROVIDER_SITE_OTHER): Payer: BC Managed Care – PPO | Admitting: Women's Health

## 2020-01-06 ENCOUNTER — Encounter: Payer: Self-pay | Admitting: Women's Health

## 2020-01-06 VITALS — BP 103/66 | HR 91 | Wt 152.0 lb

## 2020-01-06 DIAGNOSIS — Z3A33 33 weeks gestation of pregnancy: Secondary | ICD-10-CM

## 2020-01-06 DIAGNOSIS — Z331 Pregnant state, incidental: Secondary | ICD-10-CM

## 2020-01-06 DIAGNOSIS — O26843 Uterine size-date discrepancy, third trimester: Secondary | ICD-10-CM

## 2020-01-06 DIAGNOSIS — Z3403 Encounter for supervision of normal first pregnancy, third trimester: Secondary | ICD-10-CM

## 2020-01-06 DIAGNOSIS — Z1389 Encounter for screening for other disorder: Secondary | ICD-10-CM

## 2020-01-06 NOTE — Patient Instructions (Signed)
Pamela Gomez, I greatly value your feedback.  If you receive a survey following your visit with [place] today, we appreciate you taking the time to fill it out.  Thanks, Joellyn Haff, CNM, WHNP-BC  [facility] ([address] [place], [place] [address]) Entrance C, located off of [address] valet parking   Go to [email or web address] to register for FREE online childbirth classes    Call the office [phone]) or go to [facility] if:  You begin to have strong, frequent contractions  Your water breaks.  Sometimes it is a big gush of fluid, sometimes it is just a trickle that keeps getting your panties wet or running down your legs  You have vaginal bleeding.  It is normal to have a small amount of spotting if your cervix was checked.   You don't feel your baby moving like normal.  If you don't, get you something to eat and drink and lay down and focus on feeling your baby move.  You should feel at least 10 movements in 2 hours.  If you don't, you should call the office or go to [facility].   Call the office [phone]) or go to [facility] hospital for these signs of pre-eclampsia:  Severe headache that does not go away with Tylenol  Visual changes- seeing spots, double, blurred vision  Pain under your right breast or upper abdomen that does not go away with Tums or heartburn medicine  Nausea and/or vomiting  Severe swelling in your hands, feet, and face    Home Blood Pressure Monitoring for Patients   Your provider has recommended that you check your blood pressure (BP) at least once a week at home. If you do not have a blood pressure cuff at home, one will be provided for you. Contact your provider if you have not received your monitor within 1 week.   Helpful Tips for Accurate Home Blood Pressure Checks  . Don't smoke, exercise, or drink caffeine 30 minutes before checking your BP . Use the restroom before checking your BP (a full bladder  can raise your pressure) . Relax in a comfortable upright chair . Feet on the ground . Left arm resting comfortably on a flat surface at the level of your heart . Legs uncrossed . Back supported . Sit quietly and don't talk . Place the cuff on your bare arm . Adjust snuggly, so that only two fingertips can fit between your skin and the top of the cuff . Check 2 readings separated by at least one minute . Keep a log of your BP readings . For a visual, please reference this diagram: http://ccnc.care/bpdiagram  Provider Name: [facility]     Phone: [phone]  Zone 1: ALL CLEAR  Continue to monitor your symptoms:  . BP reading is less than 140 (top number) or less than 90 (bottom number)  . No right upper stomach pain . No headaches or seeing spots . No feeling nauseated or throwing up . No swelling in face and hands  Zone 2: CAUTION Call your doctor's office for any of the following:  . BP reading is greater than 140 (top number) or greater than 90 (bottom number)  . Stomach pain under your ribs in the middle or right side . Headaches or seeing spots . Feeling nauseated or throwing up . Swelling in face and hands  Zone 3: EMERGENCY  Seek immediate medical care if you have any of  the following:  . BP reading is greater than160 (top number) or greater than 110 (bottom number) . Severe headaches not improving with Tylenol . Serious difficulty catching your breath . Any worsening symptoms from Zone 2  Preterm Labor and Birth Information  The normal length of a pregnancy is 39-41 weeks. Preterm labor is when labor starts before 37 completed weeks of pregnancy. What are the risk factors for preterm labor? Preterm labor is more likely to occur in women who:  Have certain infections during pregnancy such as a bladder infection, sexually transmitted infection, or infection inside the uterus (chorioamnionitis).  Have a shorter-than-normal cervix.  Have gone into preterm  labor before.  Have had surgery on their cervix.  Are younger than age 24 or older than age 36.  Are African American.  Are pregnant with twins or multiple babies (multiple gestation).  Take street drugs or smoke while pregnant.  Do not gain enough weight while pregnant.  Became pregnant shortly after having been pregnant. What are the symptoms of preterm labor? Symptoms of preterm labor include:  Cramps similar to those that can happen during a menstrual period. The cramps may happen with diarrhea.  Pain in the abdomen or lower back.  Regular uterine contractions that may feel like tightening of the abdomen.  A feeling of increased pressure in the pelvis.  Increased watery or bloody mucus discharge from the vagina.  Water breaking (ruptured amniotic sac). Why is it important to recognize signs of preterm labor? It is important to recognize signs of preterm labor because babies who are born prematurely may not be fully developed. This can put them at an increased risk for:  Long-term (chronic) heart and lung problems.  Difficulty immediately after birth with regulating body systems, including blood sugar, body temperature, heart rate, and breathing rate.  Bleeding in the brain.  Cerebral palsy.  Learning difficulties.  Death. These risks are highest for babies who are born before 43 weeks of pregnancy. How is preterm labor treated? Treatment depends on the length of your pregnancy, your condition, and the health of your baby. It may involve: 1. Having a stitch (suture) placed in your cervix to prevent your cervix from opening too early (cerclage). 2. Taking or being given medicines, such as: ? Hormone medicines. These may be given early in pregnancy to help support the pregnancy. ? Medicine to stop contractions. ? Medicines to help mature the baby's lungs. These may be prescribed if the risk of delivery is high. ? Medicines to prevent your baby from developing  cerebral palsy. If the labor happens before 34 weeks of pregnancy, you may need to stay in the hospital. What should I do if I think I am in preterm labor? If you think that you are going into preterm labor, call your health care provider right away. How can I prevent preterm labor in future pregnancies? To increase your chance of having a full-term pregnancy:  Do not use any tobacco products, such as cigarettes, chewing tobacco, and e-cigarettes. If you need help quitting, ask your health care provider.  Do not use street drugs or medicines that have not been prescribed to you during your pregnancy.  Talk with your health care provider before taking any herbal supplements, even if you have been taking them regularly.  Make sure you gain a healthy amount of weight during your pregnancy.  Watch for infection. If you think that you might have an infection, get it checked right away.  Make sure to  tell your health care provider if you have gone into preterm labor before. This information is not intended to replace advice given to you by your health care provider. Make sure you discuss any questions you have with your health care provider. Document Revised: 09/13/2018 Document Reviewed: 10/13/2015 Elsevier Patient Education  [place].

## 2020-01-06 NOTE — Progress Notes (Signed)
   LOW-RISK PREGNANCY VISIT Patient name: Pamela Gomez MRN [id number]  Date of birth: 1996/03/08 Chief Complaint:   Routine Prenatal Visit  History of Present Illness:   Pamela Gomez is a 24 y.o. G1P0 female at [redacted]w[redacted]d with an Estimated Date of Delivery: 02/24/20 being seen today for ongoing management of a low-risk pregnancy.  Depression screen [facility] 2/9 11/25/2019 08/14/2019 07/02/2019  Decreased Interest 0 - 0  Down, Depressed, Hopeless 0 0 0  PHQ - 2 Score 0 0 0  Altered sleeping 0 0 -  Tired, decreased energy 0 0 -  Change in appetite 0 0 -  Feeling bad or failure about yourself  0 0 -  Trouble concentrating 0 0 -  Moving slowly or fidgety/restless 0 0 -  Suicidal thoughts 0 0 -  PHQ-9 Score 0 0 -    Today she reports no complaints. Contractions: Not present. Vag. Bleeding: None.  Movement: Present. denies leaking of fluid. Review of Systems:   Pertinent items are noted in HPI Denies abnormal vaginal discharge w/ itching/odor/irritation, headaches, visual changes, shortness of breath, chest pain, abdominal pain, severe nausea/vomiting, or problems with urination or bowel movements unless otherwise stated above. Pertinent History Reviewed:  Reviewed past medical,surgical, social, obstetrical and family history.  Reviewed problem list, medications and allergies. Physical Assessment:   Vitals:   01/06/20 0850  BP: 103/66  Pulse: 91  Weight: 152 lb (68.9 kg)  Body mass index is 26.93 kg/m.        Physical Examination:   General appearance: Well appearing, and in no distress  Mental status: Alert, oriented to person, place, and time  Skin: Warm & dry  Cardiovascular: Normal heart rate noted  Respiratory: Normal respiratory effort, no distress  Abdomen: Soft, gravid, nontender  Pelvic: Cervical exam deferred         Extremities: Edema: None  Fetal Status: Fetal Heart Rate (bpm): 155 Fundal Height: 28 cm Movement: Present    Chaperone: n/a    No results found for this or  any previous visit (from the past 24 hour(s)).  Assessment & Plan:  1) Low-risk pregnancy G1P0 at [redacted]w[redacted]d with an Estimated Date of Delivery: 02/24/20   2) Persistent size <dates, last EFW 6wks ago was 31%, will get another   Meds: No orders of the defined types were placed in this encounter.  Labs/procedures today: none  Plan:  Continue routine obstetrical care  Next visit: prefers in person    Reviewed: Preterm labor symptoms and general obstetric precautions including but not limited to vaginal bleeding, contractions, leaking of fluid and fetal movement were reviewed in detail with the patient.  All questions were answered. Has home bp cuff. Check bp weekly, let us know if >140/90.   Follow-up: Return for ASAP, US:EFW; then 2wks LROB w/ CNM in person .  Orders Placed This Encounter  Procedures  . US OB Follow Up  . POC Urinalysis Dipstick OB   Cheral Marker CNM, [facility] 01/06/2020 9:20 AM

## 2020-01-07 ENCOUNTER — Ambulatory Visit (INDEPENDENT_AMBULATORY_CARE_PROVIDER_SITE_OTHER): Payer: Medicaid Other

## 2020-01-07 DIAGNOSIS — O26843 Uterine size-date discrepancy, third trimester: Secondary | ICD-10-CM

## 2020-01-07 DIAGNOSIS — Z3A33 33 weeks gestation of pregnancy: Secondary | ICD-10-CM

## 2020-01-07 DIAGNOSIS — Z3403 Encounter for supervision of normal first pregnancy, third trimester: Secondary | ICD-10-CM

## 2020-01-07 NOTE — Progress Notes (Signed)
[place] 33+1 wks,cephalic,anterior placenta gr 3,afi 11 cm,fhr 140 bpm,EFW 2111 g 39%

## 2020-01-20 ENCOUNTER — Encounter: Payer: BC Managed Care – PPO | Admitting: Women's Health

## 2020-01-22 NOTE — Progress Notes (Signed)
Patient ID: Pamela Gomez, female   DOB: August 23, 1995, 24 y.o.   MRN: [id number]    LOW-RISK PREGNANCY VISIT Patient name: Pamela Gomez MRN [id number]  Date of birth: 08-Dec-1995 Chief Complaint:   No chief complaint on file.  History of Present Illness:   Pamela Gomez is a 24 y.o. G1P0 female at [redacted]w[redacted]d with an Estimated Date of Delivery: 02/24/20 being seen today for ongoing management of a low-risk pregnancy.   Review ultrasound from 01/07/2020.   Depression screen [facility] 2/9 11/25/2019 08/14/2019 07/02/2019  Decreased Interest 0 - 0  Down, Depressed, Hopeless 0 0 0  PHQ - 2 Score 0 0 0  Altered sleeping 0 0 -  Tired, decreased energy 0 0 -  Change in appetite 0 0 -  Feeling bad or failure about yourself  0 0 -  Trouble concentrating 0 0 -  Moving slowly or fidgety/restless 0 0 -  Suicidal thoughts 0 0 -  PHQ-9 Score 0 0 -    Today she reports no cramping and no leaking.     .  .   . denies leaking of fluid. Review of Systems:   Pertinent items are noted in HPI Denies abnormal vaginal discharge w/ itching/odor/irritation, headaches, visual changes, shortness of breath, chest pain, abdominal pain, severe nausea/vomiting, or problems with urination or bowel movements unless otherwise stated above. Pertinent History Reviewed:  Reviewed past medical,surgical, social, obstetrical and family history.  Reviewed problem list, medications and allergies. Physical Assessment:  There were no vitals filed for this visit.There is no height or weight on file to calculate BMI.        Physical Examination:   General appearance: Well appearing, and in no distress  Mental status: Alert, oriented to person, place, and time  Skin: Warm & dry  Cardiovascular: Normal heart rate noted  Respiratory: Normal respiratory effort, no distress  Abdomen: Soft, gravid, nontender vtx low 34.5   Pelvic: Cervical exam deferred vtx low.        Extremities:    Fetal Status:        fhr 147, 34.5  cm  Chaperone: N/A   No results found for this or any previous visit (from the past 24 hour(s)).  Assessment & Plan:  1) Low-risk pregnancy G1P0 at [redacted]w[redacted]d with an Estimated Date of Delivery: 02/24/20   2) Persistent size <dates adequate growth,   Meds: No orders of the defined types were placed in this encounter.  Labs/procedures today: none  Plan:  Continue routine obstetrical care Next visit: in person for GBS, GC/CHL  Reviewed: Preterm labor symptoms and general obstetric precautions including but not limited to vaginal bleeding, contractions, leaking of fluid and fetal movement were reviewed in detail with the patient.  All questions were answered. Check bp weekly, let us know if >140/90.   Follow-up: Return in about 1 week (around 01/30/2020) for LROB., GC/CHL, GBS  No orders of the defined types were placed in this encounter.  01/22/2020 9:31 AM  By signing my name below, I, Pietro Cassis, attest that this documentation has been prepared under the direction and in the presence of Tilda Burrow, MD. Electronically Signed: Pietro Cassis, Medical Scribe. 01/22/20. 9:31 AM.  I personally performed the services described in this documentation, which was SCRIBED in my presence. The recorded information has been reviewed and considered accurate. It has been edited as necessary during review. Tilda Burrow, MD

## 2020-01-23 ENCOUNTER — Ambulatory Visit (INDEPENDENT_AMBULATORY_CARE_PROVIDER_SITE_OTHER): Payer: BC Managed Care – PPO | Admitting: Obstetrics and Gynecology

## 2020-01-23 ENCOUNTER — Encounter: Payer: Self-pay | Admitting: Obstetrics and Gynecology

## 2020-01-23 ENCOUNTER — Other Ambulatory Visit: Payer: Self-pay

## 2020-01-23 VITALS — BP 107/67 | HR 110 | Wt 154.4 lb

## 2020-01-23 DIAGNOSIS — Z1389 Encounter for screening for other disorder: Secondary | ICD-10-CM

## 2020-01-23 DIAGNOSIS — Z3403 Encounter for supervision of normal first pregnancy, third trimester: Secondary | ICD-10-CM

## 2020-01-23 DIAGNOSIS — Z331 Pregnant state, incidental: Secondary | ICD-10-CM | POA: Diagnosis not present

## 2020-01-23 DIAGNOSIS — Z3A35 35 weeks gestation of pregnancy: Secondary | ICD-10-CM | POA: Diagnosis not present

## 2020-01-23 LAB — POCT URINALYSIS DIPSTICK OB
Blood, UA: NEGATIVE
Glucose, UA: NEGATIVE
Ketones, UA: NEGATIVE
Leukocytes, UA: NEGATIVE
Nitrite, UA: NEGATIVE
POC,PROTEIN,UA: NEGATIVE

## 2020-01-23 NOTE — Patient Instructions (Signed)
[phone] is the phone number for Pregnancy Classes or hospital tours at [facility].   You will be referred to  [email or web address]   for more information on childbirth classes   At this site you may register for classes. You may sign up for a waiting list if classes are full. Please SIGN UP FOR THIS!.   When the waiting list becomes long, sometimes new classes can be added.  [facility] [place] Call to Register: [phone] or [phone]   or   Register Online: [email or web address] THESE CLASSES FILL UP VERY QUICKLY, SO SIGN UP AS SOON AS YOU CAN!!! Please visit [facility] pregnancy website at [email or web address]  Childbirth Classes  Option 1: Birth & Baby Series ? Series of 3 weekly classes, on the same day of the week (can choose Mon-Thurs) from 6-9pm ? Helps you and your support person prepare for childbirth ? Reviews newborn care, labor & birth, cesarean birth, pain management, and comfort techniques ? Cost: $60 per couple for insured or self-pay, $30 per couple for Medicaid  Option 2: Weekend Birth & Baby ? This class is a weekend version of our Birth & Baby series.  It is designed for parents who have a difficult time fitting several weeks of classes into their schedule.   ? Covers the care of your newborn and the basics of labor and childbirth ? Friday 6:30pm-8:30pm Saturday 9am-4pm, includes lunch for you and your partner  ? Cost: $75 per couple for insured or self-pay, $30 per couple for Medicaid  Option 3: Natural Childbirth ? This series of 5 weekly classes is for expectant parents who want to learn and practice natural methods of coping with the process of labor and childbirth.  Can choose Mon or Tues, 7-9pm.   ? Covers relaxation, breathing, massage, visualization, role of the partner, and helpful positioning ? Participants learn how to be confident  in their body's ability to give birth. Class empowers and helps parents make informed decisions about care. Includes discussion that will help new parents transition into the immediate postpartum period.  ? Cost: $75 per couple for insured or self-pay, $30 per couple for Medicaid  Option 4: Online Birth & Baby ? This online class offers you the freedom to complete a Birth & Baby series in the comfort of your own home.  The flexibility of this option allows you to review sections at your own pace, at times convenient to you and your support people.  It includes additional video information, animations, quizzes and extended activities. Get organized with helpful eClass tools, checklists, and trackers.  ? Cost: $60 for 60 days of online access                                                                            Other Available Classes  Baby & Me Enjoy this time to discuss newborn & infant parenting topics and family adjustment issues with other new mothers in a relaxed environment. Each week brings a new speaker or baby-centered activity. We encourage mothers and their babies (birth to crawling) to join us. You are welcome to visit this group even if you haven't delivered yet! It's wonderful to make new friends early   and watch other moms interact with their babies. No registration or fee.  Big Brother/Big Sister Let your children share in the joy of a new brother or sister in this special class designed just for them. Discussion includes how families care for babies: swaddling, holding, diapering, safety, as well as how they can be helpful in their new role. This class is designed for children ages 2 to 6, but any age is welcome. Please register each child individually. $5 Breastfeeding Support Group This group is a mother-to-mother support circle where moms have the opportunity to share their breastfeeding experiences. A Breastfeeding Support nurse is present for questions and concerns. An infant  scale is available for weight checks. No fee or registration.  Breastfeeding Your Baby Breastfeeding is a special time for mother and child. This class will help you feel ready to begin this important relationship. Your partner is encouraged to attend with you. Learn what to expect and feel more confident in the first days of breastfeeding your newborn. This class also addresses the most common fears and challenges of breastfeeding during the first few weeks, months, and beyond. $30 per couple Caring for Baby This class is for expectant and adoptive parents who want to learn and practice the most up-to-date newborn care for their babies. Focus is on birth through first six weeks of life. Topics include feeding, bathing, diapering, crying, umbilical cord care, circumcision care and safe sleep. Parents learn how to recognize symptoms of illness and when to call the pediatrician. Register only the mom-to-be and your partner can plan to come with you. (*Note: This class is included in the Birth & Baby series and the Weekend Birth & Baby classes.) $10 per couple Comfort Techniques & Tour This 2-hour interactive class is designed for those who either do not wish to take the Birth & Baby series or for those who prefer our online childbirth class, but don't want to miss the opportunity to learn and practice hands-on techniques. These skills can help relieve some of the discomfort of labor and encourage your baby to rotate toward the best position for birth. You and your partner will be able to try a variety of labor positions with birth balls and rebozos as well as practice breathing, relaxation, and visual techniques. $20 per couple Daddy Boot Camp This course offers Dads-to-be the tools and knowledge needed to feel confident on their journey to becoming new fathers. Experienced dads, who have been trained as coaches, teach dads-to-be how to hold, comfort, diapers, swaddle and play with their infant while being  able to support the new mom as well. $25 Grandparent Love Expecting a grandbaby? Learn about the latest infant care and safety recommendations and ways to support your own child as he or she transitions into the parenting role. $10 per person Infant and Child CPR Parents, grandparents, babysitters, and friends learn Cardio-Pulmonary Resuscitation skills for infants and children. You will also learn how to treat both conscious and unconscious choking infants and children. Register each participant individually. (Note: This Family & Friends program does not offer certification.) $20 per person Marvelous Multiples Expecting twins, triplets, or more? This free 2-hour class covers the differences in labor, birth, parenting, and breastfeeding issues that face multiples' parents.  [facility] Virtual Tour  Online virtual tour of the new [facility] at   Mom Talk This free mom-led group offers support and connection to mothers as they journey through the adjustments and struggles of that   sometimes overwhelming first year after the birth of a child. A member of our staff will be present to share resources and additional support if needed, as you care for yourself and baby. You are welcome to visit this group before you deliver! It's wonderful to meet new friends early and watch other moms interact with their babies.  Waterbirth Class Interested in a waterbirth? This free informational class will help you discover whether waterbirth is the right fit for you and is required if you are planning a waterbirth. Education about waterbirth itself, supplies you may need, and what you may need from your support team is included in this class. Partners are encouraged to come.    

## 2020-02-04 ENCOUNTER — Ambulatory Visit (INDEPENDENT_AMBULATORY_CARE_PROVIDER_SITE_OTHER): Payer: BC Managed Care – PPO | Admitting: Women's Health

## 2020-02-04 ENCOUNTER — Other Ambulatory Visit (HOSPITAL_COMMUNITY)
Admission: RE | Admit: 2020-02-04 | Discharge: 2020-02-04 | Disposition: A | Discharge status: Home / Self Care | Payer: BC Managed Care – PPO | Source: Ambulatory Visit | Attending: Obstetrics & Gynecology | Admitting: Obstetrics & Gynecology

## 2020-02-04 ENCOUNTER — Encounter: Payer: Self-pay | Admitting: Women's Health

## 2020-02-04 VITALS — BP 112/67 | HR 89 | Wt 155.0 lb

## 2020-02-04 DIAGNOSIS — Z3403 Encounter for supervision of normal first pregnancy, third trimester: Secondary | ICD-10-CM | POA: Insufficient documentation

## 2020-02-04 DIAGNOSIS — Z3A37 37 weeks gestation of pregnancy: Secondary | ICD-10-CM | POA: Diagnosis not present

## 2020-02-04 DIAGNOSIS — Z331 Pregnant state, incidental: Secondary | ICD-10-CM

## 2020-02-04 DIAGNOSIS — Z1389 Encounter for screening for other disorder: Secondary | ICD-10-CM | POA: Diagnosis not present

## 2020-02-04 DIAGNOSIS — O4693 Antepartum hemorrhage, unspecified, third trimester: Secondary | ICD-10-CM | POA: Diagnosis not present

## 2020-02-04 LAB — POCT URINALYSIS DIPSTICK OB
Blood, UA: NEGATIVE
Glucose, UA: NEGATIVE
Ketones, UA: NEGATIVE
Leukocytes, UA: NEGATIVE
Nitrite, UA: NEGATIVE
POC,PROTEIN,UA: NEGATIVE

## 2020-02-04 NOTE — Progress Notes (Signed)
LOW-RISK PREGNANCY VISIT Patient name: Pamela Gomez MRN [id number]  Date of birth: [birth year] Chief Complaint:   Routine Prenatal Visit  History of Present Illness:   Pamela Gomez is a 24 y.o. G1P0 female at [redacted]w[redacted]d with an Estimated Date of Delivery: 02/24/20 being seen today for ongoing management of a low-risk pregnancy.  Depression screen [facility] 2/9 11/25/2019 08/14/2019 07/02/2019  Decreased Interest 0 - 0  Down, Depressed, Hopeless 0 0 0  PHQ - 2 Score 0 0 0  Altered sleeping 0 0 -  Tired, decreased energy 0 0 -  Change in appetite 0 0 -  Feeling bad or failure about yourself  0 0 -  Trouble concentrating 0 0 -  Moving slowly or fidgety/restless 0 0 -  Suicidal thoughts 0 0 -  PHQ-9 Score 0 0 -    Today she reports think she lost some of mucous plug. Contractions: Not present. Vag. Bleeding: Scant.  Movement: Present. denies leaking of fluid. Review of Systems:   Pertinent items are noted in HPI Denies abnormal vaginal discharge w/ itching/odor/irritation, headaches, visual changes, shortness of breath, chest pain, abdominal pain, severe nausea/vomiting, or problems with urination or bowel movements unless otherwise stated above. Pertinent History Reviewed:  Reviewed past medical,surgical, social, obstetrical and family history.  Reviewed problem list, medications and allergies. Physical Assessment:   Vitals:   02/04/20 1408  BP: 112/67  Pulse: 89  Weight: 155 lb (70.3 kg)  Body mass index is 27.46 kg/m.        Physical Examination:   General appearance: Well appearing, and in no distress  Mental status: Alert, oriented to person, place, and time  Skin: Warm & dry  Cardiovascular: Normal heart rate noted  Respiratory: Normal respiratory effort, no distress  Abdomen: Soft, gravid, nontender  Pelvic: Cervical exam performed  Dilation: 1.5 Effacement (%): 80 Station: 0, blood on exam fingers, then pt reported feeling more coming out, ~6cm oblong stain of bright red blood  on pad, bleeding slowed, placed pad in underwear- will put on NST and monitor bleeding  Extremities: Edema: Trace  Fetal Status: Fetal Heart Rate (bpm): 130 Fundal Height: 30 cm Movement: Present Presentation: Vertex  NST: FHR baseline 130 bpm, Variability: moderate, Accelerations:present, Decelerations:  Absent= Cat 1/Reactive Toco: none No blood on pad at end of NST Went to br to void once more before leaving: bleeding much improved  Chaperone: Amanda Rash    Results for orders placed or performed in visit on 02/04/20 (from the past 24 hour(s))  POC Urinalysis Dipstick OB   Collection Time: 02/04/20  2:09 PM  Result Value Ref Range   Color, UA     Clarity, UA     Glucose, UA Negative Negative   Bilirubin, UA     Ketones, UA neg    Spec Grav, UA     Blood, UA neg    pH, UA     POC,PROTEIN,UA Negative Negative, Trace, Small (1+), Moderate (2+), Large (3+), 4+   Urobilinogen, UA     Nitrite, UA neg    Leukocytes, UA Negative Negative   Appearance     Odor      Assessment & Plan:  1) Low-risk pregnancy G1P0 at [redacted]w[redacted]d with an Estimated Date of Delivery: 02/24/20   2) Increased vb w/ SVE, reactive NST, resolved before leaving, precautions given   Meds: No orders of the defined types were placed in this encounter.  Labs/procedures today: gbs, gc/ct, sve, nst  Plan:  Continue routine obstetrical care  Next visit: prefers in person    Reviewed: Term labor symptoms and general obstetric precautions including but not limited to vaginal bleeding, contractions, leaking of fluid and fetal movement were reviewed in detail with the patient.  All questions were answered.   Follow-up: Return in about 1 week (around 02/11/2020) for LROB, CNM.  Orders Placed This Encounter  Procedures  . Strep Gp B NAA+Rflx  . POC Urinalysis Dipstick OB   Cheral Marker CNM, [facility] 02/04/2020 5:11 PM

## 2020-02-04 NOTE — Patient Instructions (Signed)
Adira Donnelly Angelica, I greatly value your feedback.  If you receive a survey following your visit with [place] today, we appreciate you taking the time to fill it out.  Thanks, Joellyn Haff, CNM, WHNP-BC  [facility] ([address] [place], [place] [address]) Entrance C, located off of [address] valet parking   Go to [email or web address] to register for FREE online childbirth classes    Call the office [phone]) or go to [facility] if:  You begin to have strong, frequent contractions  Your water breaks.  Sometimes it is a big gush of fluid, sometimes it is just a trickle that keeps getting your panties wet or running down your legs  You have vaginal bleeding.  It is normal to have a small amount of spotting if your cervix was checked.   You don't feel your baby moving like normal.  If you don't, get you something to eat and drink and lay down and focus on feeling your baby move.  You should feel at least 10 movements in 2 hours.  If you don't, you should call the office or go to [facility].   Call the office [phone]) or go to [facility] hospital for these signs of pre-eclampsia:  Severe headache that does not go away with Tylenol  Visual changes- seeing spots, double, blurred vision  Pain under your right breast or upper abdomen that does not go away with Tums or heartburn medicine  Nausea and/or vomiting  Severe swelling in your hands, feet, and face    Home Blood Pressure Monitoring for Patients   Your provider has recommended that you check your blood pressure (BP) at least once a week at home. If you do not have a blood pressure cuff at home, one will be provided for you. Contact your provider if you have not received your monitor within 1 week.   Helpful Tips for Accurate Home Blood Pressure Checks   Don't smoke, exercise, or drink caffeine 30 minutes before checking your BP  Use the restroom before checking your BP (a full  bladder can raise your pressure)  Relax in a comfortable upright chair  Feet on the ground  Left arm resting comfortably on a flat surface at the level of your heart  Legs uncrossed  Back supported  Sit quietly and don't talk  Place the cuff on your bare arm  Adjust snuggly, so that only two fingertips can fit between your skin and the top of the cuff  Check 2 readings separated by at least one minute  Keep a log of your BP readings  For a visual, please reference this diagram: http://ccnc.care/bpdiagram  Provider Name: [facility]     Phone: [phone]  Zone 1: ALL CLEAR  Continue to monitor your symptoms:   BP reading is less than 140 (top number) or less than 90 (bottom number)   No right upper stomach pain  No headaches or seeing spots  No feeling nauseated or throwing up  No swelling in face and hands  Zone 2: CAUTION Call your doctor's office for any of the following:   BP reading is greater than 140 (top number) or greater than 90 (bottom number)   Stomach pain under your ribs in the middle or right side  Headaches or seeing spots  Feeling nauseated or throwing up  Swelling in face and hands  Zone 3: EMERGENCY  Seek immediate medical care if you have any of  the following:   BP reading is greater than160 (top number) or greater than 110 (bottom number)  Severe headaches not improving with Tylenol  Serious difficulty catching your breath  Any worsening symptoms from Zone 2   Braxton Hicks Contractions Contractions of the uterus can occur throughout pregnancy, but they are not always a sign that you are in labor. You may have practice contractions called Braxton Hicks contractions. These false labor contractions are sometimes confused with true labor. What are [place] contractions? Braxton Hicks contractions are tightening movements that occur in the muscles of the uterus before labor. Unlike true labor contractions, these  contractions do not result in opening (dilation) and thinning of the cervix. Toward the end of pregnancy (32-34 weeks), Braxton Hicks contractions can happen more often and may become stronger. These contractions are sometimes difficult to tell apart from true labor because they can be very uncomfortable. You should not feel embarrassed if you go to the hospital with false labor. Sometimes, the only way to tell if you are in true labor is for your health care provider to look for changes in the cervix. The health care provider will do a physical exam and may monitor your contractions. If you are not in true labor, the exam should show that your cervix is not dilating and your water has not broken. If there are no other health problems associated with your pregnancy, it is completely safe for you to be sent home with false labor. You may continue to have Braxton Hicks contractions until you go into true labor. How to tell the difference between true labor and false labor True labor  Contractions last 30-70 seconds.  Contractions become very regular.  Discomfort is usually felt in the top of the uterus, and it spreads to the lower abdomen and low back.  Contractions do not go away with walking.  Contractions usually become more intense and increase in frequency.  The cervix dilates and gets thinner. False labor  Contractions are usually shorter and not as strong as true labor contractions.  Contractions are usually irregular.  Contractions are often felt in the front of the lower abdomen and in the groin.  Contractions may go away when you walk around or change positions while lying down.  Contractions get weaker and are shorter-lasting as time goes on.  The cervix usually does not dilate or become thin. Follow these instructions at home:  1. Take over-the-counter and prescription medicines only as told by your health care provider. 2. Keep up with your usual exercises and follow other  instructions from your health care provider. 3. Eat and drink lightly if you think you are going into labor. 4. If Braxton Hicks contractions are making you uncomfortable: ? Change your position from lying down or resting to walking, or change from walking to resting. ? Sit and rest in a tub of warm water. ? Drink enough fluid to keep your urine pale yellow. Dehydration may cause these contractions. ? Do slow and deep breathing several times an hour. 5. Keep all follow-up prenatal visits as told by your health care provider. This is important. Contact a health care provider if:  You have a fever.  You have continuous pain in your abdomen. Get help right away if:  Your contractions become stronger, more regular, and closer together.  You have fluid leaking or gushing from your vagina.  You pass blood-tinged mucus (bloody show).  You have bleeding from your vagina.  You have low back  pain that you never had before.  You feel your babys head pushing down and causing pelvic pressure.  Your baby is not moving inside you as much as it used to. Summary  Contractions that occur before labor are called Braxton Hicks contractions, false labor, or practice contractions.  Braxton Hicks contractions are usually shorter, weaker, farther apart, and less regular than true labor contractions. True labor contractions usually become progressively stronger and regular, and they become more frequent.  Manage discomfort from [facility] contractions by changing position, resting in a warm bath, drinking plenty of water, or practicing deep breathing. This information is not intended to replace advice given to you by your health care provider. Make sure you discuss any questions you have with your health care provider. Document Revised: 05/04/2017 Document Reviewed: 10/05/2016 Elsevier Patient Education  [place].

## 2020-02-06 LAB — CERVICOVAGINAL ANCILLARY ONLY
Chlamydia: NEGATIVE
Comment: NEGATIVE
Comment: NORMAL
Neisseria Gonorrhea: NEGATIVE

## 2020-02-06 LAB — STREP GP B NAA+RFLX: Strep Gp B NAA+Rflx: NEGATIVE

## 2020-02-12 ENCOUNTER — Encounter: Payer: Self-pay | Admitting: Advanced Practice Midwife

## 2020-02-12 ENCOUNTER — Ambulatory Visit (INDEPENDENT_AMBULATORY_CARE_PROVIDER_SITE_OTHER): Payer: BC Managed Care – PPO | Admitting: Advanced Practice Midwife

## 2020-02-12 VITALS — BP 118/78 | HR 102 | Wt 157.0 lb

## 2020-02-12 DIAGNOSIS — Z3403 Encounter for supervision of normal first pregnancy, third trimester: Secondary | ICD-10-CM

## 2020-02-12 DIAGNOSIS — Z3A38 38 weeks gestation of pregnancy: Secondary | ICD-10-CM

## 2020-02-12 DIAGNOSIS — Z331 Pregnant state, incidental: Secondary | ICD-10-CM

## 2020-02-12 DIAGNOSIS — Z1389 Encounter for screening for other disorder: Secondary | ICD-10-CM

## 2020-02-12 NOTE — Progress Notes (Signed)
   LOW-RISK PREGNANCY VISIT Patient name: Pamela Gomez MRN [id number]  Date of birth: 06/07/1995 Chief Complaint:   Routine Prenatal Visit  History of Present Illness:   Pamela Gomez is a 24 y.o. G1P0 female at [redacted]w[redacted]d with an Estimated Date of Delivery: 02/24/20 being seen today for ongoing management of a low-risk pregnancy.  Today she reports no complaints. Contractions: Irregular. Vag. Bleeding: None.  Movement: Present. denies leaking of fluid. Review of Systems:   Pertinent items are noted in HPI Denies abnormal vaginal discharge w/ itching/odor/irritation, headaches, visual changes, shortness of breath, chest pain, abdominal pain, severe nausea/vomiting, or problems with urination or bowel movements unless otherwise stated above. Pertinent History Reviewed:  Reviewed past medical,surgical, social, obstetrical and family history.  Reviewed problem list, medications and allergies. Physical Assessment:   Vitals:   02/12/20 0859  BP: 118/78  Pulse: (!) 102  Weight: 157 lb (71.2 kg)  Body mass index is 27.81 kg/m.        Physical Examination:   General appearance: Well appearing, and in no distress  Mental status: Alert, oriented to person, place, and time  Skin: Warm & dry  Cardiovascular: Normal heart rate noted  Respiratory: Normal respiratory effort, no distress  Abdomen: Soft, gravid, nontender  Pelvic: Cervical exam performed  Dilation: 1.5 Effacement (%): 80 Station: -1  Extremities:    Fetal Status: Fetal Heart Rate (bpm): 149 Fundal Height: 31 cm Movement: Present Presentation: Vertex  Chaperone: Peggy Dones    No results found for this or any previous visit (from the past 24 hour(s)).  Assessment & Plan:  1) Low-risk pregnancy G1P0 at [redacted]w[redacted]d with an Estimated Date of Delivery: 02/24/20     Meds: No orders of the defined types were placed in this encounter.  Labs/procedures today: none  Plan:  Continue routine obstetrical care  Next visit: prefers in person      Reviewed: Term labor symptoms and general obstetric precautions including but not limited to vaginal bleeding, contractions, leaking of fluid and fetal movement were reviewed in detail with the patient.  All questions were answered. Has home bp cuff. Check bp weekly, let us know if >140/90.   Follow-up: Return in about 1 week (around 02/19/2020) for LROB.  Orders Placed This Encounter  Procedures  . POC Urinalysis Dipstick OB   Jacklyn Shell DNP, CNM 02/12/2020 9:20 AM

## 2020-02-12 NOTE — Patient Instructions (Signed)

## 2020-02-19 ENCOUNTER — Ambulatory Visit (INDEPENDENT_AMBULATORY_CARE_PROVIDER_SITE_OTHER): Payer: BC Managed Care – PPO | Admitting: Obstetrics & Gynecology

## 2020-02-19 ENCOUNTER — Encounter: Payer: Self-pay | Admitting: Obstetrics & Gynecology

## 2020-02-19 VITALS — BP 109/72 | HR 89 | Wt 157.0 lb

## 2020-02-19 DIAGNOSIS — Z3403 Encounter for supervision of normal first pregnancy, third trimester: Secondary | ICD-10-CM | POA: Diagnosis not present

## 2020-02-19 DIAGNOSIS — Z3A39 39 weeks gestation of pregnancy: Secondary | ICD-10-CM

## 2020-02-19 DIAGNOSIS — Z1389 Encounter for screening for other disorder: Secondary | ICD-10-CM | POA: Diagnosis not present

## 2020-02-19 DIAGNOSIS — Z331 Pregnant state, incidental: Secondary | ICD-10-CM | POA: Diagnosis not present

## 2020-02-19 LAB — POCT URINALYSIS DIPSTICK OB
Blood, UA: NEGATIVE
Glucose, UA: NEGATIVE
Ketones, UA: NEGATIVE
Leukocytes, UA: NEGATIVE
Nitrite, UA: NEGATIVE
POC,PROTEIN,UA: NEGATIVE

## 2020-02-19 NOTE — Progress Notes (Signed)
LOW-RISK PREGNANCY VISIT Patient name: Pamela Gomez MRN [id number]  Date of birth: 1996/01/29 Chief Complaint:   Routine Prenatal Visit  History of Present Illness:   Pamela Gomez is a 24 y.o. G1P0 female at [redacted]w[redacted]d with an Estimated Date of Delivery: 02/24/20 being seen today for ongoing management of a low-risk pregnancy.  Depression screen [facility] 2/9 11/25/2019 08/14/2019 07/02/2019  Decreased Interest 0 - 0  Down, Depressed, Hopeless 0 0 0  PHQ - 2 Score 0 0 0  Altered sleeping 0 0 -  Tired, decreased energy 0 0 -  Change in appetite 0 0 -  Feeling bad or failure about yourself  0 0 -  Trouble concentrating 0 0 -  Moving slowly or fidgety/restless 0 0 -  Suicidal thoughts 0 0 -  PHQ-9 Score 0 0 -    Today she reports no complaints. Contractions: Irritability. Vag. Bleeding: None.  Movement: Present. denies leaking of fluid. Review of Systems:   Pertinent items are noted in HPI Denies abnormal vaginal discharge w/ itching/odor/irritation, headaches, visual changes, shortness of breath, chest pain, abdominal pain, severe nausea/vomiting, or problems with urination or bowel movements unless otherwise stated above. Pertinent History Reviewed:  Reviewed past medical,surgical, social, obstetrical and family history.  Reviewed problem list, medications and allergies. Physical Assessment:   Vitals:   02/19/20 0836  BP: 109/72  Pulse: 89  Weight: 157 lb (71.2 kg)  Body mass index is 27.81 kg/m.        Physical Examination:   General appearance: Well appearing, and in no distress  Mental status: Alert, oriented to person, place, and time  Skin: Warm & dry  Cardiovascular: Normal heart rate noted  Respiratory: Normal respiratory effort, no distress  Abdomen: Soft, gravid, nontender  Pelvic: Cervical exam performed  Dilation: 2.5 Effacement (%): 50 Station: -1  Extremities: Edema: Trace  Fetal Status: Fetal Heart Rate (bpm): 133 Fundal Height: 34 cm Movement: Present  Presentation: Vertex  Chaperone: Peggy Dones    Results for orders placed or performed in visit on 02/19/20 (from the past 24 hour(s))  POC Urinalysis Dipstick OB   Collection Time: 02/19/20  8:38 AM  Result Value Ref Range   Color, UA     Clarity, UA     Glucose, UA Negative Negative   Bilirubin, UA     Ketones, UA neg    Spec Grav, UA     Blood, UA neg    pH, UA     POC,PROTEIN,UA Negative Negative, Trace, Small (1+), Moderate (2+), Large (3+), 4+   Urobilinogen, UA     Nitrite, UA neg    Leukocytes, UA Negative Negative   Appearance     Odor      Assessment & Plan:  1) Low-risk pregnancy G1P0 at [redacted]w[redacted]d with an Estimated Date of Delivery: 02/24/20   2) NST next week if undelivered, membranes swept today,    Meds: No orders of the defined types were placed in this encounter.  Labs/procedures today:   Plan:  Continue routine obstetrical care  Next visit: prefers     Reviewed:  labor symptoms and general obstetric precautions including but not limited to vaginal bleeding, contractions, leaking of fluid and fetal movement were reviewed in detail with the patient.  All questions were answered.  home bp cuff. Rx faxed to . Check bp weekly, let us know if >140/90.   Follow-up: Return in about 1 week (around 02/26/2020) for LROB, NST.  Orders Placed This Encounter  Procedures   POC Urinalysis Dipstick OB   Lazaro Arms  02/19/2020 9:26 AM

## 2020-02-26 ENCOUNTER — Ambulatory Visit (INDEPENDENT_AMBULATORY_CARE_PROVIDER_SITE_OTHER): Payer: BC Managed Care – PPO | Admitting: Obstetrics and Gynecology

## 2020-02-26 VITALS — BP 118/73 | HR 97 | Wt 159.0 lb

## 2020-02-26 DIAGNOSIS — Z3403 Encounter for supervision of normal first pregnancy, third trimester: Secondary | ICD-10-CM

## 2020-02-26 DIAGNOSIS — Z3A40 40 weeks gestation of pregnancy: Secondary | ICD-10-CM

## 2020-02-26 DIAGNOSIS — Z1389 Encounter for screening for other disorder: Secondary | ICD-10-CM

## 2020-02-26 DIAGNOSIS — Z331 Pregnant state, incidental: Secondary | ICD-10-CM

## 2020-02-26 LAB — POCT URINALYSIS DIPSTICK OB
Blood, UA: NEGATIVE
Glucose, UA: NEGATIVE
Ketones, UA: NEGATIVE
Leukocytes, UA: NEGATIVE
Nitrite, UA: NEGATIVE
POC,PROTEIN,UA: NEGATIVE

## 2020-02-26 NOTE — Progress Notes (Signed)
Patient ID: Pamela Gomez, female   DOB: 1995/11/08, 24 y.o.   MRN: [id number]    LOW-RISK PREGNANCY VISIT Patient name: Pamela Gomez MRN [id number]  Date of birth: 04-27-1996 Chief Complaint:   No chief complaint on file.  History of Present Illness:   Pamela Gomez is a 24 y.o. G1P0 female at [redacted]w[redacted]d with an Estimated Date of Delivery: 02/24/20 being seen today for ongoing management of a low-risk pregnancy.  Depression screen [facility] 2/9 11/25/2019 08/14/2019 07/02/2019  Decreased Interest 0 - 0  Down, Depressed, Hopeless 0 0 0  PHQ - 2 Score 0 0 0  Altered sleeping 0 0 -  Tired, decreased energy 0 0 -  Change in appetite 0 0 -  Feeling bad or failure about yourself  0 0 -  Trouble concentrating 0 0 -  Moving slowly or fidgety/restless 0 0 -  Suicidal thoughts 0 0 -  PHQ-9 Score 0 0 -    Today she reports no complaints. Her membranes were swept at her last visit.    .  .   . denies leaking of fluid. Review of Systems:   Pertinent items are noted in HPI Denies abnormal vaginal discharge w/ itching/odor/irritation, headaches, visual changes, shortness of breath, chest pain, abdominal pain, severe nausea/vomiting, or problems with urination or bowel movements unless otherwise stated above. Pertinent History Reviewed:  Reviewed past medical,surgical, social, obstetrical and family history.  Reviewed problem list, medications and allergies. Physical Assessment:  There were no vitals filed for this visit.There is no height or weight on file to calculate BMI.        Physical Examination:   General appearance: Well appearing, and in no distress  Mental status: Alert, oriented to person, place, and time  Skin: Warm & dry  Cardiovascular: Normal heart rate noted  Respiratory: Normal respiratory effort, no distress  Abdomen: Soft, gravid, nontender  Pelvic: Cervical exam performed         Extremities:    Fetal Status:          Chaperone:   No results found for this or any previous  visit (from the past 24 hour(s)).  Assessment & Plan:  1) Low-risk pregnancy G1P0 at [redacted]w[redacted]d with an Estimated Date of Delivery: 02/24/20   2) IOL scheduled for 28 Sept 6 am   Meds: No orders of the defined types were placed in this encounter.  Labs/procedures today: NST reactive  Plan:  Continue routine obstetrical care Next visit: prefers in person    Reviewed: Term labor symptoms and general obstetric precautions including but not limited to vaginal bleeding, contractions, leaking of fluid and fetal movement were reviewed in detail with the patient.  All questions were answered. Check bp weekly, let us know if >140/90.   Follow-up: No follow-ups on file.  No orders of the defined types were placed in this encounter.  02/26/2020 12:36 PM  By signing my name below, I, Pietro Cassis, attest that this documentation has been prepared under the direction and in the presence of Tilda Burrow, MD. Electronically Signed: Pietro Cassis, Medical Scribe. 02/26/20. 12:36 PM.  I personally performed the services described in this documentation, which was SCRIBED in my presence. The recorded information has been reviewed and considered accurate. It has been edited as necessary during review. Tilda Burrow, MD

## 2020-02-27 ENCOUNTER — Other Ambulatory Visit: Payer: Self-pay | Admitting: Advanced Practice Midwife

## 2020-02-27 ENCOUNTER — Telehealth (HOSPITAL_COMMUNITY): Payer: Self-pay | Admitting: *Deleted

## 2020-02-27 NOTE — Telephone Encounter (Signed)
Preadmission screen  

## 2020-03-01 ENCOUNTER — Inpatient Hospital Stay (HOSPITAL_COMMUNITY)
Admission: AD | Admit: 2020-03-01 | Discharge: 2020-03-03 | DRG: 807 | Disposition: A | Discharge status: Home / Self Care | Payer: BC Managed Care – PPO | Attending: Family Medicine | Admitting: Family Medicine

## 2020-03-01 ENCOUNTER — Other Ambulatory Visit (HOSPITAL_COMMUNITY)
Admission: RE | Admit: 2020-03-01 | Discharge: 2020-03-01 | Disposition: A | Discharge status: Home / Self Care | Payer: BC Managed Care – PPO | Source: Ambulatory Visit | Attending: Family Medicine | Admitting: Family Medicine

## 2020-03-01 ENCOUNTER — Encounter (HOSPITAL_COMMUNITY): Payer: Self-pay | Admitting: Obstetrics and Gynecology

## 2020-03-01 ENCOUNTER — Other Ambulatory Visit: Payer: Self-pay

## 2020-03-01 DIAGNOSIS — Z3A41 41 weeks gestation of pregnancy: Secondary | ICD-10-CM | POA: Diagnosis present

## 2020-03-01 DIAGNOSIS — Z20822 Contact with and (suspected) exposure to covid-19: Secondary | ICD-10-CM | POA: Insufficient documentation

## 2020-03-01 DIAGNOSIS — Z01812 Encounter for preprocedural laboratory examination: Secondary | ICD-10-CM | POA: Insufficient documentation

## 2020-03-01 DIAGNOSIS — Z23 Encounter for immunization: Secondary | ICD-10-CM | POA: Diagnosis not present

## 2020-03-01 DIAGNOSIS — O26893 Other specified pregnancy related conditions, third trimester: Secondary | ICD-10-CM | POA: Diagnosis present

## 2020-03-01 DIAGNOSIS — O480 Post-term pregnancy: Secondary | ICD-10-CM | POA: Diagnosis present

## 2020-03-01 DIAGNOSIS — O4202 Full-term premature rupture of membranes, onset of labor within 24 hours of rupture: Secondary | ICD-10-CM | POA: Diagnosis not present

## 2020-03-01 DIAGNOSIS — Z3A40 40 weeks gestation of pregnancy: Secondary | ICD-10-CM

## 2020-03-01 LAB — CBC
HCT: 34.8 % — ABNORMAL LOW (ref 36.0–46.0)
Hemoglobin: 11.7 g/dL — ABNORMAL LOW (ref 12.0–15.0)
MCH: 29.7 pg (ref 26.0–34.0)
MCHC: 33.6 g/dL (ref 30.0–36.0)
MCV: 88.3 fL (ref 80.0–100.0)
Platelets: 215 10*3/uL (ref 150–400)
RBC: 3.94 MIL/uL (ref 3.87–5.11)
RDW: 13.2 % (ref 11.5–15.5)
WBC: 17.9 10*3/uL — ABNORMAL HIGH (ref 4.0–10.5)
nRBC: 0 % (ref 0.0–0.2)

## 2020-03-01 LAB — TYPE AND SCREEN
ABO/RH(D): A POS
Antibody Screen: NEGATIVE

## 2020-03-01 LAB — SARS CORONAVIRUS 2 (TAT 6-24 HRS): SARS Coronavirus 2: NEGATIVE

## 2020-03-01 LAB — POCT FERN TEST: POCT Fern Test: POSITIVE

## 2020-03-01 MED ORDER — OXYTOCIN-SODIUM CHLORIDE 30-0.9 UT/500ML-% IV SOLN: 2.5000 [IU]/h | INTRAVENOUS | Status: DC

## 2020-03-01 MED ORDER — ONDANSETRON HCL 4 MG/2ML IJ SOLN
4.0000 mg | Freq: Four times a day (QID) | INTRAMUSCULAR | Status: DC | PRN
  Administered 2020-03-02: 4 mg via INTRAVENOUS
  Filled 2020-03-01: qty 2

## 2020-03-01 MED ORDER — OXYTOCIN BOLUS FROM INFUSION
333.0000 mL | Freq: Once | INTRAVENOUS | Status: AC
  Administered 2020-03-02: 333 mL via INTRAVENOUS

## 2020-03-01 MED ORDER — MISOPROSTOL 25 MCG QUARTER TABLET: 25.0000 ug | ORAL_TABLET | ORAL | Status: DC | PRN

## 2020-03-01 MED ORDER — OXYTOCIN-SODIUM CHLORIDE 30-0.9 UT/500ML-% IV SOLN
1.0000 m[IU]/min | INTRAVENOUS | Status: DC
  Administered 2020-03-02: 2 m[IU]/min via INTRAVENOUS
  Filled 2020-03-01: qty 500

## 2020-03-01 MED ORDER — ACETAMINOPHEN 325 MG PO TABS: 650.0000 mg | ORAL_TABLET | ORAL | Status: DC | PRN

## 2020-03-01 MED ORDER — OXYCODONE-ACETAMINOPHEN 5-325 MG PO TABS: 2.0000 | ORAL_TABLET | ORAL | Status: DC | PRN

## 2020-03-01 MED ORDER — SOD CITRATE-CITRIC ACID 500-334 MG/5ML PO SOLN: 30.0000 mL | ORAL | Status: DC | PRN

## 2020-03-01 MED ORDER — LIDOCAINE HCL (PF) 1 % IJ SOLN
30.0000 mL | INTRAMUSCULAR | Status: AC | PRN
  Administered 2020-03-02: 30 mL via SUBCUTANEOUS
  Filled 2020-03-01: qty 30

## 2020-03-01 MED ORDER — TERBUTALINE SULFATE 1 MG/ML IJ SOLN: 0.2500 mg | Freq: Once | INTRAMUSCULAR | Status: DC | PRN

## 2020-03-01 MED ORDER — LACTATED RINGERS IV SOLN: INTRAVENOUS | Status: DC

## 2020-03-01 MED ORDER — OXYCODONE-ACETAMINOPHEN 5-325 MG PO TABS: 1.0000 | ORAL_TABLET | ORAL | Status: DC | PRN

## 2020-03-01 MED ORDER — LACTATED RINGERS IV SOLN: 500.0000 mL | INTRAVENOUS | Status: DC | PRN

## 2020-03-01 MED ORDER — FENTANYL CITRATE (PF) 100 MCG/2ML IJ SOLN: 100.0000 ug | INTRAMUSCULAR | Status: DC | PRN

## 2020-03-01 NOTE — H&P (Signed)
OBSTETRIC ADMISSION HISTORY AND PHYSICAL  Pamela Gomez is a 24 y.o. female G1P0 with IUP at [redacted]w[redacted]d by L/8 presenting for SROM at 0900 this morning. She reports +FMs, no VB, no blurry vision, headaches or peripheral edema, and RUQ pain.  She plans on breast and bottle feeding. She requests POPs for birth control. She received her prenatal care at [facility]   Dating: By L/8 --->  Estimated Date of Delivery: 02/24/20  Sono:  @[redacted]w[redacted]d , CWD, normal anatomy, cephalic presentation, 2111g, 02[birth year]13[birth year]1978 EFW   Prenatal History/Complications: size < dates but reassuringly, appropriate growth at [redacted]w[redacted]d as noted above  Past Medical History: Past Medical History:  Diagnosis Date   Medical history non-contributory     Past Surgical History: Past Surgical History:  Procedure Laterality Date   GANGLION CYST EXCISION     WISDOM TOOTH EXTRACTION      Obstetrical History: OB History    Gravida  1   Para      Term      Preterm      AB      Living        SAB      TAB      Ectopic      Multiple      Live Births              Social History Social History   Socioeconomic History   Marital status: Single    Spouse name: Not on file   Number of children: Not on file   Years of education: Not on file   Highest education level: Not on file  Occupational History   Not on file  Tobacco Use   Smoking status: Never Smoker   Smokeless tobacco: Never Used  Vaping Use   Vaping Use: Never used  Substance and Sexual Activity   Alcohol use: Not Currently   Drug use: Never   Sexual activity: Yes    Birth control/protection: None  Other Topics Concern   Not on file  Social History Narrative   Not on file   Social Determinants of Health   Financial Resource Strain: Medium Risk   Difficulty of Paying Living Expenses: Somewhat hard  Food Insecurity: No Food Insecurity   Worried About [redacted]w[redacted]d in the Last Year: Never true   Ran Out of Food in the Last  Year: Never true  Transportation Needs: No Transportation Needs   Lack of Transportation (Medical): No   Lack of Transportation (Non-Medical): No  Physical Activity: Sufficiently Active   Days of Exercise per Week: 7 days   Minutes of Exercise per Session: 60 min  Stress: No Stress Concern Present   Feeling of Stress : Not at all  Social Connections: Moderately Integrated   Frequency of Communication with Friends and Family: Three times a week   Frequency of Social Gatherings with Friends and Family: More than three times a week   Attends Religious Services: 1 to 4 times per year   Active Member of Programme researcher, broadcasting/film/video or Organizations: No   Attends [employer] Meetings: Never   Marital Status: Living with partner    Family History: Family History  Problem Relation Age of Onset   Heart attack Paternal Grandfather    Cancer Paternal Grandfather    Stroke Paternal Grandmother    Diabetes Father    Cancer Mother        liver   Breast cancer Mother    Hypothyroidism Mother  Hypothyroidism Sister    Cancer Maternal Uncle     Allergies: Allergies  Allergen Reactions   Eye Drops  [Carboxymethylcellulose Sodium] Other (See Comments)    Patient and father do not know reaction; just know she is allergic  Polytrim eye drops   Penicillins Hives    Medications Prior to Admission  Medication Sig Dispense Refill Last Dose   cyclobenzaprine (FLEXERIL) 10 MG tablet Take 1 tablet (10 mg total) by mouth every 8 (eight) hours as needed for muscle spasms. 30 tablet 1 Past Month at Unknown time   Fexofenadine HCl (ALLEGRA PO) Take by mouth.   03/01/2020 at Unknown time   Prenatal Vit-Fe Fumarate-FA (PRENATAL VITAMIN PO) Take by mouth.   03/01/2020 at Unknown time   Blood Pressure Monitor MISC For regular home bp monitoring during pregnancy 1 each 0      Review of Systems   All systems reviewed and negative except as stated in HPI  Blood pressure 110/75, pulse (!)  109, temperature 98.7 F (37.1 C), temperature source Oral, resp. rate 15, height 5\' 3"  (1.6 m), weight 72.6 kg, last menstrual period 05/20/2019, SpO2 100 %. General appearance: alert, cooperative and appears stated age Lungs: clear to auscultation bilaterally Heart: regular rate and rhythm Abdomen: soft, non-tender; bowel sounds normal Extremities: Homans sign is negative, no sign of DVT Presentation: cephalic on bedside ultrasound Fetal monitoringBaseline: 120 bpm, Variability: Good {> 6 bpm), Accelerations: Reactive and Decelerations: Absent Uterine activityFrequency: Every 4-5 minutes Dilation: 2.5 Effacement (%): 80 Station: 0 Exam by:: K.Wilson,RN  Prenatal labs: ABO, Rh: A/Positive/-- (03/11 1125) Antibody: Negative (06/22 0908) Rubella: 3.79 (03/11 1125) RPR: Non Reactive (06/22 0908)  HBsAg: Negative (03/11 1125)  HIV: Non Reactive (06/22 0908)  GBS: --11-25-1971 (09/01 1500)  1 hr Glucola wnl Genetic screening wnl Anatomy 11-01-1972 wnl @[redacted]w[redacted]d   Prenatal Transfer Tool  Maternal Diabetes: No Genetic Screening: Normal Maternal Ultrasounds/Referrals: Normal Fetal Ultrasounds or other Referrals:  None Maternal Substance Abuse:  No Significant Maternal Medications:  None Significant Maternal Lab Results: None  Results for orders placed or performed during the hospital encounter of 03/01/20 (from the past 24 hour(s))  POCT fern test   Collection Time: 03/01/20  8:14 PM  Result Value Ref Range   POCT Fern Test Positive = ruptured amniotic membanes     Patient Active Problem List   Diagnosis Date Noted   Supervision of normal first pregnancy 08/14/2019    Assessment/Plan:  Pamela Gomez is a 24 y.o. G1P0 at [redacted]w[redacted]d here for labor management s/p SROM at home (03/01/20 at [facility]).  #Labor: will manage expectantly and augment as clinically indicated #Pain: Per patient--TBD #FWB: Category 1 strip, reactive #ID: GBS negative #MOF: both #MOC: POPs #Circ: n/a  03/03/20,  MD  03/01/2020, 8:33 PM

## 2020-03-01 NOTE — MAU Note (Signed)
Pt reports leaking fluid since 0900. Some pressure but no contractions that she is aware of. Reports good fetal movement.

## 2020-03-02 ENCOUNTER — Inpatient Hospital Stay (HOSPITAL_COMMUNITY)
Admission: AD | Admit: 2020-03-02 | Payer: BC Managed Care – PPO | Source: Home / Self Care | Admitting: Obstetrics and Gynecology

## 2020-03-02 ENCOUNTER — Encounter (HOSPITAL_COMMUNITY): Payer: Self-pay | Admitting: Family Medicine

## 2020-03-02 ENCOUNTER — Inpatient Hospital Stay (HOSPITAL_COMMUNITY): Payer: BC Managed Care – PPO | Admitting: Anesthesiology

## 2020-03-02 ENCOUNTER — Inpatient Hospital Stay (HOSPITAL_COMMUNITY): Payer: BC Managed Care – PPO | Attending: Obstetrics and Gynecology

## 2020-03-02 DIAGNOSIS — Z3A41 41 weeks gestation of pregnancy: Secondary | ICD-10-CM

## 2020-03-02 DIAGNOSIS — O480 Post-term pregnancy: Secondary | ICD-10-CM

## 2020-03-02 DIAGNOSIS — O4202 Full-term premature rupture of membranes, onset of labor within 24 hours of rupture: Secondary | ICD-10-CM

## 2020-03-02 LAB — RPR: RPR Ser Ql: NONREACTIVE

## 2020-03-02 MED ORDER — IBUPROFEN 600 MG PO TABS
600.0000 mg | ORAL_TABLET | Freq: Three times a day (TID) | ORAL | Status: DC
  Administered 2020-03-02 – 2020-03-03 (×3): 600 mg via ORAL
  Filled 2020-03-02 (×3): qty 1

## 2020-03-02 MED ORDER — INFLUENZA VAC SPLIT QUAD 0.5 ML IM SUSY
0.5000 mL | PREFILLED_SYRINGE | INTRAMUSCULAR | Status: AC
  Administered 2020-03-03: 0.5 mL via INTRAMUSCULAR
  Filled 2020-03-02: qty 0.5

## 2020-03-02 MED ORDER — EPHEDRINE 5 MG/ML INJ: 10.0000 mg | INTRAVENOUS | Status: DC | PRN

## 2020-03-02 MED ORDER — TETANUS-DIPHTH-ACELL PERTUSSIS 5-2.5-18.5 LF-MCG/0.5 IM SUSP: 0.5000 mL | Freq: Once | INTRAMUSCULAR | Status: DC

## 2020-03-02 MED ORDER — PHENYLEPHRINE 40 MCG/ML (10ML) SYRINGE FOR IV PUSH (FOR BLOOD PRESSURE SUPPORT): 80.0000 ug | PREFILLED_SYRINGE | INTRAVENOUS | Status: DC | PRN

## 2020-03-02 MED ORDER — ONDANSETRON HCL 4 MG/2ML IJ SOLN: 4.0000 mg | INTRAMUSCULAR | Status: DC | PRN

## 2020-03-02 MED ORDER — FENTANYL-BUPIVACAINE-NACL 0.5-0.125-0.9 MG/250ML-% EP SOLN: 12.0000 mL/h | EPIDURAL | Status: DC | PRN

## 2020-03-02 MED ORDER — PRENATAL MULTIVITAMIN CH
1.0000 | ORAL_TABLET | Freq: Every day | ORAL | Status: DC
  Administered 2020-03-02 – 2020-03-03 (×2): 1 via ORAL
  Filled 2020-03-02 (×2): qty 1

## 2020-03-02 MED ORDER — DIBUCAINE (PERIANAL) 1 % EX OINT: 1.0000 "application " | TOPICAL_OINTMENT | CUTANEOUS | Status: DC | PRN

## 2020-03-02 MED ORDER — ACETAMINOPHEN 325 MG PO TABS
650.0000 mg | ORAL_TABLET | Freq: Four times a day (QID) | ORAL | Status: DC
  Administered 2020-03-02 – 2020-03-03 (×5): 650 mg via ORAL
  Filled 2020-03-02 (×5): qty 2

## 2020-03-02 MED ORDER — WITCH HAZEL-GLYCERIN EX PADS: 1.0000 "application " | MEDICATED_PAD | CUTANEOUS | Status: DC | PRN

## 2020-03-02 MED ORDER — DIPHENHYDRAMINE HCL 25 MG PO CAPS: 25.0000 mg | ORAL_CAPSULE | Freq: Four times a day (QID) | ORAL | Status: DC | PRN

## 2020-03-02 MED ORDER — LACTATED RINGERS IV SOLN: 500.0000 mL | Freq: Once | INTRAVENOUS | Status: DC

## 2020-03-02 MED ORDER — COCONUT OIL OIL
1.0000 "application " | TOPICAL_OIL | Status: DC | PRN
  Administered 2020-03-02: 1 via TOPICAL

## 2020-03-02 MED ORDER — FENTANYL CITRATE (PF) 2500 MCG/50ML IJ SOLN
INTRAMUSCULAR | Status: DC | PRN
Start: 2020-03-02 — End: 2020-03-02
  Administered 2020-03-02: 12 mL/h via EPIDURAL

## 2020-03-02 MED ORDER — ZOLPIDEM TARTRATE 5 MG PO TABS: 5.0000 mg | ORAL_TABLET | Freq: Every evening | ORAL | Status: DC | PRN

## 2020-03-02 MED ORDER — SIMETHICONE 80 MG PO CHEW: 80.0000 mg | CHEWABLE_TABLET | ORAL | Status: DC | PRN

## 2020-03-02 MED ORDER — ONDANSETRON HCL 4 MG PO TABS: 4.0000 mg | ORAL_TABLET | ORAL | Status: DC | PRN

## 2020-03-02 MED ORDER — BENZOCAINE-MENTHOL 20-0.5 % EX AERO
1.0000 "application " | INHALATION_SPRAY | CUTANEOUS | Status: DC | PRN
  Administered 2020-03-02: 1 via TOPICAL
  Filled 2020-03-02: qty 56

## 2020-03-02 MED ORDER — SENNOSIDES-DOCUSATE SODIUM 8.6-50 MG PO TABS
2.0000 | ORAL_TABLET | ORAL | Status: DC
  Administered 2020-03-02: 2 via ORAL
  Filled 2020-03-02: qty 2

## 2020-03-02 MED ORDER — DIPHENHYDRAMINE HCL 50 MG/ML IJ SOLN: 12.5000 mg | INTRAMUSCULAR | Status: DC | PRN

## 2020-03-02 MED ORDER — LIDOCAINE HCL (PF) 1 % IJ SOLN
INTRAMUSCULAR | Status: DC | PRN
  Administered 2020-03-02: 10 mL via EPIDURAL

## 2020-03-02 MED ORDER — FENTANYL-BUPIVACAINE-NACL 0.5-0.125-0.9 MG/250ML-% EP SOLN
12.0000 mL/h | EPIDURAL | Status: DC | PRN
  Filled 2020-03-02 (×2): qty 250

## 2020-03-02 NOTE — Discharge Summary (Signed)
Postpartum Discharge Summary    Patient Name: Pamela Gomez DOB: [birth year] MRN: [id number]  Date of admission: 03/01/2020 Delivery date:03/02/2020  Delivering provider: Randa Ngo  Date of discharge: 03/03/2020  Admitting diagnosis: Post term pregnancy at [redacted] weeks gestation [O48.0, Z3A.41] Intrauterine pregnancy: [redacted]w[redacted]d    Secondary diagnosis:  Active Problems:   Post term pregnancy at 471weeks gestation   Vaginal delivery  Additional problems: as noted above   Discharge diagnosis: Vaginal delivery                                            Post partum procedures:none Augmentation: Pitocin Complications: None  Hospital course: Onset of Labor With Vaginal Delivery      24y.o. yo G1P1001 at 42w0das admitted in Latent Labor s/p SROM at home on 03/01/2020. Patient had an uncomplicated labor course as follows:  Membrane Rupture Time/Date: 9:00 AM ,03/01/2020   Delivery Method:Vaginal, Spontaneous  Episiotomy: None  Lacerations:  2nd degree;Sulcus  Patient had an uncomplicated postpartum course.  She is ambulating, tolerating a regular diet, passing flatus, and urinating well. Patient is discharged home in stable condition on 03/03/20.  Newborn Data: Birth date:03/02/2020  Birth time:7:33 AM  Gender:Female  Living status:Living  Apgars:8 ,9  Weight:3315 g   Magnesium Sulfate received: No BMZ received: No Rhophylac:N/A MMR:Yes T-DaP:Given prenatally Flu: Yes Transfusion:No  Physical exam  Vitals:   03/02/20 1122 03/02/20 1654 03/02/20 2056 03/03/20 0526  BP: (!) 106/58 100/62 (!) 110/58 105/68  Pulse: 90 88 93 83  Resp: _0 Temp: 98.1 F (36.7 C) 97.8 F (36.6 C) 97.6 F (36.4 C) 98 F (36.7 C)  TempSrc: Oral Oral Oral Oral  SpO2:  99% 100% 100%  Weight:      Height:       General: alert, cooperative and no distress Lochia: appropriate Uterine Fundus: firm Incision: N/A DVT Evaluation: No evidence of DVT seen on physical exam. No cords or calf  tenderness. No significant calf/ankle edema. Labs: Lab Results  Component Value Date   WBC 17.9 (H) 03/01/2020   HGB 11.7 (L) 03/01/2020   HCT 34.8 (L) 03/01/2020   MCV 88.3 03/01/2020   PLT 215 03/01/2020   No flowsheet data found. Edinburgh Score: Edinburgh Postnatal Depression Scale Screening Tool 03/02/2020  I have been able to laugh and see the funny side of things. 0  I have looked forward with enjoyment to things. 0  I have blamed myself unnecessarily when things went wrong. 1  I have been anxious or worried for no good reason. 1  I have felt scared or panicky for no good reason. 0  Things have been getting on top of me. 0  I have been so unhappy that I have had difficulty sleeping. 0  I have felt sad or miserable. 1  I have been so unhappy that I have been crying. 0  The thought of harming myself has occurred to me. 0  Edinburgh Postnatal Depression Scale Total 3     After visit meds:  Allergies as of 03/03/2020      Reactions   Eye Drops  [carboxymethylcellulose Sodium] Other (See Comments)   Patient and father do not know reaction; just know she is allergic  Polytrim eye drops   Penicillins Hives      Medication List    STOP  taking these medications   Blood Pressure Monitor Misc   cyclobenzaprine 10 MG tablet Commonly known as: FLEXERIL     TAKE these medications   acetaminophen 325 MG tablet Commonly known as: Tylenol Take 2 tablets (650 mg total) by mouth every 6 (six) hours as needed.   coconut oil Oil Apply 1 application topically as needed (nipple pain).   fexofenadine 180 MG tablet Commonly known as: ALLEGRA Take 180 mg by mouth daily.   ibuprofen 600 MG tablet Commonly known as: ADVIL Take 1 tablet (600 mg total) by mouth every 8 (eight) hours as needed for moderate pain or cramping.   norethindrone 0.35 MG tablet Commonly known as: Ortho Micronor Take 1 tablet (0.35 mg total) by mouth daily.   PRENATAL VITAMIN PO Take 1 tablet by mouth  daily.        Discharge home in stable condition Infant Feeding: No evidence of DVT seen on physical exam. No cords or calf tenderness. No significant calf/ankle edema. Infant Disposition:home with mother Discharge instruction: per After Visit Summary and Postpartum booklet. Activity: Advance as tolerated. Pelvic rest for 6 weeks.  Diet: routine diet Future Appointments: Future Appointments  Date Time Provider [place]  04/06/2020  1:30 PM Roma Schanz, CNM [facility]   Follow up Visit:   Please schedule this patient for a In person postpartum visit in 4 weeks with the following provider: Any provider. Additional Postpartum F/U:none  Low risk pregnancy complicated by: none Delivery mode:  Vaginal, Spontaneous  Anticipated Birth Control:  POPs  03/03/2020 Randa Ngo, MD

## 2020-03-02 NOTE — Anesthesia Preprocedure Evaluation (Signed)

## 2020-03-02 NOTE — Anesthesia Postprocedure Evaluation (Signed)
Anesthesia Post Note  Patient: Pamela Gomez  Procedure(s) Performed: AN AD HOC LABOR EPIDURAL     Patient location during evaluation: Mother Baby Anesthesia Type: Epidural Level of consciousness: awake and alert Pain management: pain level controlled Vital Signs Assessment: post-procedure vital signs reviewed and stable Respiratory status: spontaneous breathing, nonlabored ventilation and respiratory function stable Cardiovascular status: stable Postop Assessment: no headache, no backache, epidural receding, no apparent nausea or vomiting, patient able to bend at knees, adequate PO intake and able to ambulate Anesthetic complications: no   No complications documented.  Last Vitals:  Vitals:   03/02/20 1122 03/02/20 1654  BP: (!) 106/58 100/62  Pulse: 90 88  Resp: 18 17  Temp: 36.7 C 36.6 C  SpO2:  99%    Last Pain:  Vitals:   03/02/20 1654  TempSrc: Oral  PainSc: 4    Pain Goal:                   [place]

## 2020-03-02 NOTE — Lactation Note (Signed)
This note was copied from a baby's chart. Lactation Consultation Note  Patient Name: Pamela Gomez Today's Date: 03/02/2020   [facility] spoke with mom and baby's RN who reports she will ask parents if they would like to see lactation.   Maternal Data    Feeding Feeding Type: Breast Fed  [facility] [place] [facility] Score                   Interventions    Lactation Tools Discussed/Used     Consult Status      Pamela Gomez Pamela Gomez 03/02/2020, 4:32 PM

## 2020-03-02 NOTE — Anesthesia Procedure Notes (Signed)
Epidural Patient location during procedure: OB Start time: 03/02/2020 4:22 AM End time: 03/02/2020 4:32 AM  Staffing Anesthesiologist: Lucretia Kern, MD Performed: anesthesiologist   Preanesthetic Checklist Completed: patient identified, IV checked, risks and benefits discussed, monitors and equipment checked, pre-op evaluation and timeout performed  Epidural Patient position: sitting Prep: DuraPrep Patient monitoring: heart rate, continuous pulse ox and blood pressure Approach: midline Location: L3-L4 Injection technique: LOR air  Needle:  Needle type: Tuohy  Needle gauge: 17 G Needle length: 9 cm Needle insertion depth: 6 cm Catheter type: closed end flexible Catheter size: 19 Gauge Catheter at skin depth: 11 cm Test dose: negative  Assessment Events: blood not aspirated, injection not painful, no injection resistance, no paresthesia and negative IV test  Additional Notes Reason for block:procedure for pain

## 2020-03-02 NOTE — Progress Notes (Signed)
Labor Progress Note Pamela Gomez is a 24 y.o. G1P0 at [redacted]w[redacted]d presented for labor s/p SROM S: CTSP regarding increased vaginal bleeding.  Pt also noted increased discomfort even with epidural.  Fetal tracing still appropriate.  O:  BP (!) 104/55   Pulse 90   Temp 98 F (36.7 C) (Oral)   Resp 18   Ht 5\' 3"  (1.6 m)   Wt 72.6 kg   LMP 05/20/2019   SpO2 96%   BMI 28.34 kg/m  FHT:  110's baseline, moderate variability, toco q 2-3 minutes  CVE: C/C/+1 Moderate bloody show, darker red blood noted  [name]: 24 y.o. G1P0 [redacted]w[redacted]d  #Labor: Progressing well. Will start trial pushing. ANSVD [redacted]w[redacted]d, MD 6:22 AM

## 2020-03-02 NOTE — Lactation Note (Signed)
This note was copied from a baby's chart. Lactation Consultation Note Attempted to see mom, mom resting. Will try again this shift.  Patient Name: Pamela Gomez Today's Date: 03/02/2020     Maternal Data    Feeding Feeding Type: Breast Fed  LATCH Score Latch: Grasps breast easily, tongue down, lips flanged, rhythmical sucking.  Audible Swallowing: A few with stimulation  Type of Nipple: Everted at rest and after stimulation  Comfort (Breast/Nipple): Soft / non-tender  Hold (Positioning): No assistance needed to correctly position infant at breast.  LATCH Score: 9  Interventions    Lactation Tools Discussed/Used     Consult Status      Nealy Hickmon, Diamond Nickel 03/02/2020, 10:10 PM

## 2020-03-03 ENCOUNTER — Ambulatory Visit: Payer: Self-pay

## 2020-03-03 MED ORDER — IBUPROFEN 600 MG PO TABS: 600.0000 mg | ORAL_TABLET | Freq: Three times a day (TID) | ORAL | 0 refills | Status: DC | PRN

## 2020-03-03 MED ORDER — NORETHINDRONE 0.35 MG PO TABS: 1.0000 | ORAL_TABLET | Freq: Every day | ORAL | 6 refills | Status: DC

## 2020-03-03 MED ORDER — COCONUT OIL OIL: 1.0000 "application " | TOPICAL_OIL | 0 refills | Status: DC | PRN

## 2020-03-03 MED ORDER — ACETAMINOPHEN 325 MG PO TABS: 650.0000 mg | ORAL_TABLET | Freq: Four times a day (QID) | ORAL | Status: DC | PRN

## 2020-03-03 NOTE — Lactation Note (Signed)
This note was copied from a baby's chart. Lactation Consultation Note  Patient Name: Pamela Gomez Today's Date: 03/03/2020 Reason for consult: Initial assessment;Primapara    Mom discharging with infant and desired a visit from Lactation.  Order received for lactation visit. When visiting, mom states she was going to ask for help from lactation at some point today but really thought she was going home tomorrow.  She feels infant feeds well on the left side but is unable to get infant latched to the right.  Infant is asleep in dad's arms.   LC suggested setting up an OP LC appt., attending a BFSG, and calling our phone line for questions.  Brochure provided.    Family has follow up in [place] at pediatrician tomorrow.    Mom mentions again how sore the left nipple is, so would like help latching to the right.  Upon viewing the right nipple, it appeared red with bruised crevices on the top of the nipple.    Mom asked for suggestion on feeding infant on other side.  LC offered to provide latch assist with next feed.  Parents preferred to stay and get an LC to observe a feeding prior to leaving.    RN notified or parents requests.   Maternal Data Has patient been taught Hand Expression?:  (pt. states she knows how to hand express) Does the patient have breastfeeding experience prior to this delivery?: No  Feeding    LATCH Score                   Interventions    Lactation Tools Discussed/Used     Consult Status Consult Status: Follow-up (Per pt. request: pt desires latch assist) Date: 03/03/20    Maryruth Hancock [facility] 03/03/2020, 7:01 PM

## 2020-03-03 NOTE — Lactation Note (Signed)
This note was copied from a baby's chart. Lactation Consultation Note Attempted to see mom again but sleeping.  Patient Name: Pamela Gomez Date: 03/03/2020     Maternal Data    Feeding Feeding Type: Formula Nipple Type: Slow - flow  LATCH Score                   Interventions    Lactation Tools Discussed/Used     Consult Status      Dia Jefferys G 03/03/2020, 4:54 AM

## 2020-03-03 NOTE — Lactation Note (Signed)
This note was copied from a baby's chart. Lactation Consultation Note Baby 36 hrs old being d/c home. Hadn't been seen by lactation until now. This LC attempted 2 times last night and mom was sleeping.  When LC entered room baby was crying, mom attempting to bf in cradle position. Mom was talking to baby trying to calm her to get her latched. Mom was in recliner. Asked mom if she would get into the bed so I could work w/her better. Mom was happy to.  LC discussed positioning, support, props, cheeks to breast, how to hold breast for latching to have better control. Mom has been BF in cradle and cross cradle. Assisted in football hold.  Mom has short shaft everted nipples w/cracked centers. Nipples are pink, and tender. Baby latched suckling, mom wrenching. LC done chin tug. Baby didn't have deep latch. Mom has small areolas.  When baby cried noted heart shaped tongue, labial frenulum. Assessed suck w/gloved finger. Mom doing a lot of gumming. LC massaged tongue to extend under LC's finger. Baby suckled better then.  Gave mom #20 NS. Taught mom application. Mom demonstrated 3 times after LC showed her several time. Encouraged "C" hold when latching.  Baby latched well. Demonstrated upper lip flange and chin tug. Suggested mom or FOB to do this to ensure proper latch.  Baby has recessed chin and bottom lip falls back.  Baby latched well, BF great. Strongly encouraged cheeks to breast. Placed folded blanket placed under mom's hand.  Baby BF heard a few occasional swallows. When baby fell asleep at the breast, removed NS. No colostrum puddle inside but had some residue inside.  Gave mom contact information about tongue tie referrals. Suggested mom to call tomorrow morning OP LC to make OP appointment. Baby has Dr. Alfonzo Beers. In am.   Baby is jaundice in appearance. Baby had bili serum at lunch. RN checked TCB at [facility] request, was WDL.  Discussed w/mom about if she noticed baby getting more  sleepy, hard to wake, out put slowing down, baby turning more yellow call MD. Discussed continue to do STS, and writing I&O.  Newborn feeding habits, behavior, breast massage, milk storage, pumping, hand expressing, supplementing after BF, engorgement, supply and demand discussed.  Mom stated she felt comfortable going home w/NS and BF. Stated it didn't hurt. Encouraged mom to use her DEBP at home to pump q3 hrs for stimulation.  Shells given to mom to evert nipples more. Hand pump given to pre-pump prior to latching. Curve tip syring given, demonstrated how to insert colostrum into NS for supplementing.  Encouraged supplementing since looks jaundice. Stressed importance of hand expression after pumping. Mom stated she can easily hand express colostrum.  Gave mom colostrum container. Gave mom comfort gels to wear after BF. Instructed not to wear w/ointments or anything on her nipples and apply Comfort gels.   Parents thanks LC for coming to see her. Encouraged to call for OP LC appt. Again.   Patient Name: Pamela Gomez [id number] Date: 03/03/2020 Reason for consult: Initial assessment;Primapara;Term;Nipple pain/trauma   Maternal Data Has patient been taught Hand Expression?: Yes Does the patient have breastfeeding experience prior to this delivery?: No  Feeding Feeding Type: Breast Fed  LATCH Score Latch: Grasps breast easily, tongue down, lips flanged, rhythmical sucking.  Audible Swallowing: A few with stimulation  Type of Nipple: Everted at rest and after stimulation  Comfort (Breast/Nipple): Filling, red/small blisters or bruises, mild/mod discomfort (cracked tender pink nipples)  Hold (Positioning): Assistance  needed to correctly position infant at breast and maintain latch.  LATCH Score: 7  Interventions Interventions: Breast feeding basics reviewed;Assisted with latch;Breast compression;Shells;Skin to skin;Adjust position;Comfort gels;Breast massage;Support  pillows;Hand pump;Hand express;Position options;Pre-pump if needed  Lactation Tools Discussed/Used Tools: Shells;Pump;Nipple Shields;Comfort gels Nipple shield size: 20 Shell Type: Inverted Breast pump type: Manual WIC Program: No Pump Review: Setup, frequency, and cleaning;Milk Storage Initiated by:: Peri Jefferson RN, IBCLC Date initiated:: 03/03/20   Consult Status Consult Status: Complete Date: 03/03/20    Charyl Dancer 03/03/2020, 7:57 PM

## 2020-04-06 ENCOUNTER — Ambulatory Visit (INDEPENDENT_AMBULATORY_CARE_PROVIDER_SITE_OTHER): Payer: BC Managed Care – PPO | Admitting: Women's Health

## 2020-04-06 ENCOUNTER — Encounter: Payer: Self-pay | Admitting: Women's Health

## 2020-04-06 DIAGNOSIS — Z3009 Encounter for other general counseling and advice on contraception: Secondary | ICD-10-CM

## 2020-04-06 LAB — POCT URINE PREGNANCY: Preg Test, Ur: NEGATIVE

## 2020-04-06 NOTE — Patient Instructions (Signed)
Tips To Increase Milk Supply Lots of water! Enough so that your urine is clear Plenty of calories, if you're not getting enough calories, your milk supply can decrease Breastfeed/pump often, every 2-3 hours x 20-30mins Fenugreek 3 pills 3 times a day, this may make your urine smell like maple syrup Mother's Milk Tea Lactation cookies, google for the recipe Real oatmeal Body Armor sports drinks Greater Than hydration drink  

## 2020-04-06 NOTE — Progress Notes (Signed)
POSTPARTUM VISIT Patient name: Pamela Gomez MRN [id number]  Date of birth: Jul 01, 1995 Chief Complaint:   Postpartum Care  History of Present Illness:   Pamela Gomez is a 24 y.o. G51P1001 Caucasian female being seen today for a postpartum visit. She is 5 weeks postpartum following a spontaneous vaginal delivery at 41.0 gestational weeks. IOL: No, for n/a. Anesthesia: epidural.  Laceration: 2nd degree.  Complications: none. Inpatient contraception: no.   Pregnancy uncomplicated. Tobacco use: no. Substance use disorder: no. Last pap smear: 2020 at [facility] and results were normal. Next pap smear due: 2023 Patient's last menstrual period was 05/20/2019.  Postpartum course has been uncomplicated. Bleeding no bleeding. Bowel function is normal. Bladder function is normal. Urinary incontinence? No, fecal incontinence? No Patient is not sexually active. Last sexual activity: prior to birth of baby.  Desired contraception: POPs, already has rx. Patient does not want a pregnancy in the near future.  Desired family size is unsure # children.   The pregnancy intention screening data noted above was reviewed. Potential methods of contraception were discussed. The patient elected to proceed with Oral Contraceptive.   Edinburgh Postpartum Depression Screening: negative  Edinburgh Postnatal Depression Scale - 04/06/20 1345      Edinburgh Postnatal Depression Scale:  In the Past 7 Days   I have been able to laugh and see the funny side of things. 0    I have looked forward with enjoyment to things. 0    I have blamed myself unnecessarily when things went wrong. 1    I have been anxious or worried for no good reason. 0    I have felt scared or panicky for no good reason. 1    Things have been getting on top of me. 1    I have been so unhappy that I have had difficulty sleeping. 0    I have felt sad or miserable. 1    I have been so unhappy that I have been crying. 1    The thought of harming myself has  occurred to me. 0    Edinburgh Postnatal Depression Scale Total 5          Baby's course has been uncomplicated. Baby is feeding by breast and bottle: milk supply inadequate . Infant has a pediatrician/family doctor? Yes.  Pt has material needs met for her and baby: Yes.   Review of Systems:   Pertinent items are noted in HPI Denies Abnormal vaginal discharge w/ itching/odor/irritation, headaches, visual changes, shortness of breath, chest pain, abdominal pain, severe nausea/vomiting, or problems with urination or bowel movements. Pertinent History Reviewed:  Reviewed past medical,surgical, obstetrical and family history.  Reviewed problem list, medications and allergies. OB History  Gravida Para Term Preterm AB Living  '1 1 1     1  ' SAB TAB Ectopic Multiple Live Births        0 1    # Outcome Date GA Lbr Len/2nd Weight Sex Delivery Anes PTL Lv  1 Term 03/02/20 [id number] / 01:14 7 lb 4.9 oz (3.315 kg) F Vag-Spont EPI  LIV   Physical Assessment:   Vitals:   04/06/20 1349  BP: 110/67  Pulse: 100  Weight: 140 lb 12.8 oz (63.9 kg)  Body mass index is 24.94 kg/m.       Physical Examination:   General appearance: alert, well appearing, and in no distress  Mental status: alert, oriented to person, place, and time  Skin: warm & dry  Cardiovascular: normal heart rate noted   Respiratory: normal respiratory effort, no distress   Breasts: deferred, no complaints   Abdomen: soft, non-tender   Pelvic: VULVA: lac healing well  Rectal: no hemorrhoids  Extremities: no edema  Chaperone: Glenard Haring [place]         Results for orders placed or performed in visit on 04/06/20 (from the past 24 hour(s))  POCT urine pregnancy   Collection Time: 04/06/20  1:51 PM  Result Value Ref Range   Preg Test, Ur Negative Negative    Assessment & Plan:  1) Postpartum exam 2) 5 wks s/p spontaneous vaginal delivery 3) breast & bottle feeding 4) Depression screening 5) Contraception counseling, already  has rx for POPs, can start now, take at same time daily, condom if late taking/skip pill  Essential components of care per ACOG recommendations:  1.  Mood and well being:  . If positive depression screen, discussed and plan developed.  . If using tobacco we discussed reduction/cessation and risk of relapse . If current substance abuse, we discussed and referral to local resources was offered.   2. Infant care and feeding:  . If breastfeeding, discussed returning to work, pumping, breastfeeding-associated pain, guidance regarding return to fertility while lactating if not using another method. If needed, patient was provided with a letter to be allowed to pump q 2-3hrs to support lactation in a private location with access to a refrigerator to store breastmilk.   . Recommended that all caregivers be immunized for flu, pertussis and other preventable communicable diseases . If pt does not have material needs met for her/baby, referred to local resources for help obtaining these.  3. Sexuality, contraception and birth spacing . Provided guidance regarding sexuality, management of dyspareunia, and resumption of intercourse . Discussed avoiding interpregnancy interval <51mhs and recommended birth spacing of 18 months  4. Sleep and fatigue . Discussed coping options for fatigue and sleep disruption . Encouraged family/partner/community support of 4 hrs of uninterrupted sleep to help with mood and fatigue  5. Physical recovery  . If pt had a C/S, assessed incisional pain and providing guidance on normal vs prolonged recovery . If pt had a laceration, perineal healing and pain reviewed.  . If urinary or fecal incontinence, discussed management and referred to PT or uro/gyn if indicated  . Patient is safe to resume physical activity. Discussed attainment of healthy weight.  6.  Chronic disease management . Discussed pregnancy complications if any, and their implications for future childbearing  and long-term maternal health. . Review recommendations for prevention of recurrent pregnancy complications, such as 17 hydroxyprogesterone caproate to reduce risk for recurrent PTB not applicable, or aspirin to reduce risk of preeclampsia not applicable. . Pt had GDM: No. If yes, 2hr GTT scheduled: not applicable. . Reviewed medications and non-pregnant dosing including consideration of whether pt is breastfeeding using a reliable resource such as LactMed: yes . Referred for f/u w/ PCP or subspecialist providers as indicated: not applicable  7. Health maintenance . Mammogram at [address] earlier if indicated . Pap smears as indicated  Meds: No orders of the defined types were placed in this encounter.   Follow-up: Return in about 1 year (around 04/06/2021) for Physical.   Orders Placed This Encounter  Procedures  . POCT urine pregnancy    KReno [facility]11/07/2019 2:28 PM

## 2023-01-25 ENCOUNTER — Ambulatory Visit (INDEPENDENT_AMBULATORY_CARE_PROVIDER_SITE_OTHER): Payer: 59 | Admitting: Obstetrics & Gynecology

## 2023-01-25 ENCOUNTER — Other Ambulatory Visit (HOSPITAL_COMMUNITY): Admission: RE | Admit: 2023-01-25 | Payer: 59 | Source: Ambulatory Visit

## 2023-01-25 ENCOUNTER — Encounter: Payer: Self-pay | Admitting: Obstetrics & Gynecology

## 2023-01-25 VITALS — BP 115/75 | HR 97 | Ht 63.0 in | Wt 138.2 lb

## 2023-01-25 DIAGNOSIS — Z3202 Encounter for pregnancy test, result negative: Secondary | ICD-10-CM

## 2023-01-25 DIAGNOSIS — R87610 Atypical squamous cells of undetermined significance on cytologic smear of cervix (ASC-US): Secondary | ICD-10-CM | POA: Insufficient documentation

## 2023-01-25 LAB — POCT URINE PREGNANCY: Preg Test, Ur: NEGATIVE

## 2023-01-25 NOTE — Progress Notes (Signed)
    Patient name: Pamela Gomez MRN [id number]  Date of birth: 04-03-1996 Chief Complaint:   Colposcopy  History of Present Illness:   Pamela Gomez is a 27 y.o. G37P1001  female being seen today for cervical dysplasia management.  Smoker:  No. New sexual partner:  No.  Recent pap: ASC-H  Contraception: Depot  No periods with current contraception.  Reports no acute gyn concerns.  May consider one more pregnancy in the future.  No LMP recorded. Patient has had an injection.     11/25/2019    9:22 AM 08/14/2019   10:41 AM 07/02/2019    3:09 PM  Depression screen PHQ 2/9  Decreased Interest 0  0  Down, Depressed, Hopeless 0 0 0  PHQ - 2 Score 0 0 0  Altered sleeping 0 0   Tired, decreased energy 0 0   Change in appetite 0 0   Feeling bad or failure about yourself  0 0   Trouble concentrating 0 0   Moving slowly or fidgety/restless 0 0   Suicidal thoughts 0 0   PHQ-9 Score 0 0      Review of Systems:   Pertinent items are noted in HPI Denies fever/chills, dizziness, headaches, visual disturbances, fatigue, shortness of breath, chest pain, abdominal pain, vomiting, no problems with periods, bowel movements, urination, or intercourse unless otherwise stated above.  Pertinent History Reviewed:  Reviewed past medical,surgical, social, obstetrical and family history.  Reviewed problem list, medications and allergies. Physical Assessment:   Vitals:   01/25/23 1451  BP: 115/75  Pulse: 97  Weight: 138 lb 3.2 oz (62.7 kg)  Height: 5\' 3"  (1.6 m)  Body mass index is 24.48 kg/m.       Physical Examination:   General appearance: alert, well appearing, and in no distress  Psych: mood appropriate, normal affect  Skin: warm & dry   Cardiovascular: normal heart rate noted  Respiratory: normal respiratory effort, no distress  Abdomen: soft, non-tender   Pelvic: VULVA: normal appearing vulva with no masses, tenderness or lesions, VAGINA: normal appearing vagina with normal  color and discharge, no lesions, CERVIX: ectropic cervix noted  Extremities: no edema   Chaperone: Faith Rogue     Colposcopy Procedure Note  Indications: ASC-H   Procedure Details  The risks and benefits of the procedure and Written informed consent obtained.  Speculum placed in vagina and excellent visualization of cervix achieved, cervix swabbed x 3 with acetic acid solution.  Findings: Adequate colposcopy is noted today.  TMZ zone visualized/ectropic cervix  Cervix: ectropic cervix- mosaicism appreciated, no discrete lesions.  +friability.  ECC and cervical biopsies obtained.    Monsel's applied.  Adequate hemostasis noted  Specimens: ECC and cervical biopsies  Complications: none.  Colposcopic Impression: CIN 1   Plan(Based on 2019 ASCCP recommendations)  -Discussed HPV- reviewed incidence and its potential to cause condylomas to dysplasia to cervical cancer -Reviewed degree of abnormal pap smears  -Discussed ASCCP guidelines and current recommendations for colposcopy -As above, inform consent obtained and procedure completed -biopsies obtained, further management pending results -discussed potential results and treatment.  Based on age, discussed plan for conservative management -Questions and concerns were addressed  Myna Hidalgo, DO Attending Obstetrician & Gynecologist, [facility] for [facility], [facility]

## 2023-01-29 LAB — SURGICAL PATHOLOGY

## 2023-11-26 ENCOUNTER — Ambulatory Visit
Admission: EM | Admit: 2023-11-26 | Discharge: 2023-11-26 | Disposition: A | Discharge status: Home / Self Care | Attending: Physician Assistant | Admitting: Physician Assistant

## 2023-11-26 DIAGNOSIS — H1033 Unspecified acute conjunctivitis, bilateral: Secondary | ICD-10-CM

## 2023-11-26 MED ORDER — CIPROFLOXACIN HCL 0.3 % OP SOLN: OPHTHALMIC | 0 refills | Status: AC

## 2023-11-26 NOTE — ED Triage Notes (Signed)
 Pt c/o bilateral eye discharge & redness x1 day. No OTC meds.

## 2023-11-26 NOTE — ED Provider Notes (Signed)
 [facility]    CSN: [id number] Arrival date & time: 11/26/23  0805      History   Chief Complaint Chief Complaint  Patient presents with   Eye Problem    HPI Pamela Gomez is a 28 y.o. female presenting for bilateral eye redness, irritation, itching and yellowish drainage.  Patient says I literally had to pry my eyes open this morning.  Denies pain.  Reports a little bit of postnasal drainage but no fever, cough, congestion, sore throat, sinus pain, ear pain.  No sick contacts or known exposure to pinkeye.  Has tried over-the-counter eyedrops without relief.  Wears glasses.  Is a dispatcher.  HPI  Past Medical History:  Diagnosis Date   Medical history non-contributory     There are no active problems to display for this patient.   Past Surgical History:  Procedure Laterality Date   GANGLION CYST EXCISION     WISDOM TOOTH EXTRACTION      OB History     Gravida  1   Para  1   Term  1   Preterm      AB      Living  1      SAB      IAB      Ectopic      Multiple  0   Live Births  1            Home Medications    Prior to Admission medications   Medication Sig Start Date End Date Taking? Authorizing Provider  ciprofloxacin (CILOXAN) 0.3 % ophthalmic solution Both eyes-Administer 1 drop, every 2 hours, while awake, for 2 days. Then 1 drop, every 4 hours, while awake, for the next 5 days. 11/26/23  Yes Arvis Jolan NOVAK, PA-C  medroxyPROGESTERone (DEPO-PROVERA) 150 MG/ML injection Inject 150 mg into the muscle every 3 (three) months.    [provider]    Family History Family History  Problem Relation Age of Onset   Heart attack Paternal Grandfather    Cancer Paternal Grandfather    Stroke Paternal Grandmother    Diabetes Father    Cancer Mother        liver   Breast cancer Mother    Hypothyroidism Mother    Hypothyroidism Sister    Cancer Maternal Uncle     Social History Social History   Tobacco Use    Smoking status: Never   Smokeless tobacco: Never  Vaping Use   Vaping status: Never Used  Substance Use Topics   Alcohol use: Not Currently   Drug use: Never     Allergies   Eye drops  [carboxymethylcellulose sodium] and Penicillins   Review of Systems Review of Systems  Constitutional:  Negative for chills, diaphoresis, fatigue and fever.  HENT:  Positive for postnasal drip. Negative for congestion, ear pain, facial swelling, rhinorrhea, sinus pressure, sinus pain and sore throat.   Eyes:  Positive for discharge, redness and itching. Negative for photophobia, pain and visual disturbance.  Respiratory:  Negative for cough.   Musculoskeletal:  Negative for myalgias.  Skin:  Negative for rash.  Neurological:  Negative for headaches.     Physical Exam Triage Vital Signs ED Triage Vitals  Encounter Vitals Group     BP 11/26/23 0823 113/77     Girls Systolic BP Percentile --      Girls Diastolic BP Percentile --      Boys Systolic BP Percentile --  Boys Diastolic BP Percentile --      Pulse Rate 11/26/23 0823 (!) 104     Resp 11/26/23 0823 16     Temp 11/26/23 0823 98.5 F (36.9 C)     Temp Source 11/26/23 0823 Oral     SpO2 11/26/23 0823 98 %     Weight 11/26/23 0822 134 lb (60.8 kg)     Height 11/26/23 0822 5' 3 (1.6 m)     Head Circumference --      Peak Flow --      Pain Score 11/26/23 0825 0     Pain Loc --      Pain Education --      Exclude from Growth Chart --    No data found.  Updated Vital Signs BP 113/77 (BP Location: Right Arm)   Pulse (!) 104   Temp 98.5 F (36.9 C) (Oral)   Resp 16   Ht 5' 3 (1.6 m)   Wt 134 lb (60.8 kg)   SpO2 98%   BMI 23.74 kg/m    Physical Exam Vitals and nursing note reviewed.  Constitutional:      General: She is not in acute distress.    Appearance: Normal appearance. She is not ill-appearing or toxic-appearing.  HENT:     Head: Normocephalic and atraumatic.     Nose: Nose normal.     Mouth/Throat:      Mouth: Mucous membranes are moist.     Pharynx: Oropharynx is clear.   Eyes:     General: No scleral icterus.       Right eye: Discharge present.        Left eye: Discharge present.    Extraocular Movements: Extraocular movements intact.     Conjunctiva/sclera:     Right eye: Right conjunctiva is injected.     Left eye: Left conjunctiva is injected.     Pupils: Pupils are equal, round, and reactive to light.    Cardiovascular:     Rate and Rhythm: Normal rate.  Pulmonary:     Effort: Pulmonary effort is normal. No respiratory distress.   Musculoskeletal:     Cervical back: Neck supple.   Skin:    General: Skin is dry.   Neurological:     General: No focal deficit present.     Mental Status: She is alert. Mental status is at baseline.     Motor: No weakness.     Gait: Gait normal.   Psychiatric:        Mood and Affect: Mood normal.        Behavior: Behavior normal.      UC Treatments / Results  Labs (all labs ordered are listed, but only abnormal results are displayed) Labs Reviewed - No data to display  EKG   Radiology No results found.  Procedures Procedures (including critical care time)  Medications Ordered in UC Medications - No data to display  Initial Impression / Assessment and Plan / UC Course  I have reviewed the triage vital signs and the nursing notes.  Pertinent labs & imaging results that were available during my care of the patient were reviewed by me and considered in my medical decision making (see chart for details).   28 year old female presents for bilateral eye redness, irritation, itching, discomfort and yellowish drainage.  Also reports postnasal drainage but no other URI symptoms.  No relief with over-the-counter eyedrops.  Does not wear contacts regularly.  Currently wearing glasses.  Presentation consistent with  bacterial conjunctivitis.  Sent Ciloxan eyedrops.  Advised use of cool compresses, rest and fluids.  Explained that if  she develops cough and congestion this could be more like viral conjunctivitis but it should run its course as the cold does.  Work note given.   Final Clinical Impressions(s) / UC Diagnoses   Final diagnoses:  Acute bacterial conjunctivitis of both eyes   Discharge Instructions   None    ED Prescriptions     Medication Sig Dispense Auth. Provider   ciprofloxacin (CILOXAN) 0.3 % ophthalmic solution Both eyes-Administer 1 drop, every 2 hours, while awake, for 2 days. Then 1 drop, every 4 hours, while awake, for the next 5 days. 5 mL Arvis Jolan NOVAK, PA-C      PDMP not reviewed this encounter.   Arvis Jolan NOVAK, PA-C 11/26/23 [id number]
